# Patient Record
Sex: Female | Born: 1960 | Race: White | Hispanic: No | Marital: Married | Smoking: Never smoker
Health system: Southern US, Community
[De-identification: ages and names within clinical notes are randomized; demographics above are authoritative.]

## PROBLEM LIST (undated history)

## (undated) DIAGNOSIS — M8580 Other specified disorders of bone density and structure, unspecified site: Secondary | ICD-10-CM

## (undated) DIAGNOSIS — M810 Age-related osteoporosis without current pathological fracture: Secondary | ICD-10-CM

## (undated) DIAGNOSIS — M1990 Unspecified osteoarthritis, unspecified site: Secondary | ICD-10-CM

## (undated) DIAGNOSIS — E785 Hyperlipidemia, unspecified: Secondary | ICD-10-CM

## (undated) HISTORY — DX: Age-related osteoporosis without current pathological fracture: M81.0

## (undated) HISTORY — DX: Other specified disorders of bone density and structure, unspecified site: M85.80

## (undated) HISTORY — PX: LASIK: SHX215

## (undated) HISTORY — PX: TONSILLECTOMY AND ADENOIDECTOMY: SHX28

## (undated) HISTORY — DX: Unspecified osteoarthritis, unspecified site: M19.90

## (undated) HISTORY — PX: PELVIC LAPAROSCOPY: SHX162

## (undated) HISTORY — DX: Hyperlipidemia, unspecified: E78.5

---

## 1995-05-16 HISTORY — PX: OOPHORECTOMY: SHX86

## 2000-05-03 ENCOUNTER — Other Ambulatory Visit: Admission: RE | Admit: 2000-05-03 | Discharge: 2000-05-03 | Payer: Self-pay | Admitting: Gynecology

## 2002-07-22 ENCOUNTER — Other Ambulatory Visit: Admission: RE | Admit: 2002-07-22 | Discharge: 2002-07-22 | Payer: Self-pay | Admitting: Gynecology

## 2003-10-01 ENCOUNTER — Other Ambulatory Visit: Admission: RE | Admit: 2003-10-01 | Discharge: 2003-10-01 | Payer: Self-pay | Admitting: Gynecology

## 2004-10-18 ENCOUNTER — Other Ambulatory Visit: Admission: RE | Admit: 2004-10-18 | Discharge: 2004-10-18 | Payer: Self-pay | Admitting: Gynecology

## 2005-11-20 ENCOUNTER — Other Ambulatory Visit: Admission: RE | Admit: 2005-11-20 | Discharge: 2005-11-20 | Payer: Self-pay | Admitting: Gynecology

## 2006-12-17 ENCOUNTER — Other Ambulatory Visit: Admission: RE | Admit: 2006-12-17 | Discharge: 2006-12-17 | Payer: Self-pay | Admitting: Gynecology

## 2008-02-19 ENCOUNTER — Other Ambulatory Visit: Admission: RE | Admit: 2008-02-19 | Discharge: 2008-02-19 | Payer: Self-pay | Admitting: Gynecology

## 2008-02-19 ENCOUNTER — Encounter: Payer: Self-pay | Admitting: Gynecology

## 2008-02-19 ENCOUNTER — Ambulatory Visit: Payer: Self-pay | Admitting: Gynecology

## 2008-03-16 ENCOUNTER — Ambulatory Visit: Payer: Self-pay | Admitting: Gynecology

## 2009-04-21 ENCOUNTER — Other Ambulatory Visit: Admission: RE | Admit: 2009-04-21 | Discharge: 2009-04-21 | Payer: Self-pay | Admitting: Gynecology

## 2009-04-21 ENCOUNTER — Ambulatory Visit: Payer: Self-pay | Admitting: Gynecology

## 2010-05-11 ENCOUNTER — Ambulatory Visit: Payer: Self-pay | Admitting: Gynecology

## 2010-05-11 ENCOUNTER — Other Ambulatory Visit
Admission: RE | Admit: 2010-05-11 | Discharge: 2010-05-11 | Payer: Self-pay | Source: Home / Self Care | Admitting: Gynecology

## 2010-06-07 ENCOUNTER — Ambulatory Visit
Admission: RE | Admit: 2010-06-07 | Discharge: 2010-06-07 | Payer: Self-pay | Source: Home / Self Care | Attending: Gynecology | Admitting: Gynecology

## 2010-12-05 ENCOUNTER — Ambulatory Visit: Payer: Self-pay | Admitting: Unknown Physician Specialty

## 2011-06-05 ENCOUNTER — Other Ambulatory Visit: Payer: Self-pay | Admitting: *Deleted

## 2011-06-05 NOTE — Telephone Encounter (Signed)
Called in denial for refill on med.  Over due for annual exam.

## 2011-06-07 ENCOUNTER — Other Ambulatory Visit: Payer: Self-pay | Admitting: Gynecology

## 2011-06-19 ENCOUNTER — Ambulatory Visit (INDEPENDENT_AMBULATORY_CARE_PROVIDER_SITE_OTHER): Payer: PRIVATE HEALTH INSURANCE | Admitting: Gynecology

## 2011-06-19 ENCOUNTER — Encounter: Payer: Self-pay | Admitting: Gynecology

## 2011-06-19 VITALS — BP 126/80 | Ht 61.0 in | Wt 129.0 lb

## 2011-06-19 DIAGNOSIS — Z8041 Family history of malignant neoplasm of ovary: Secondary | ICD-10-CM

## 2011-06-19 DIAGNOSIS — R634 Abnormal weight loss: Secondary | ICD-10-CM

## 2011-06-19 DIAGNOSIS — N959 Unspecified menopausal and perimenopausal disorder: Secondary | ICD-10-CM

## 2011-06-19 DIAGNOSIS — Z01419 Encounter for gynecological examination (general) (routine) without abnormal findings: Secondary | ICD-10-CM

## 2011-06-19 LAB — URINALYSIS W MICROSCOPIC + REFLEX CULTURE
Hgb urine dipstick: NEGATIVE
Leukocytes, UA: NEGATIVE
Nitrite: NEGATIVE
Specific Gravity, Urine: 1.02 (ref 1.005–1.030)
pH: 5.5 (ref 5.0–8.0)

## 2011-06-19 LAB — CBC WITH DIFFERENTIAL/PLATELET
Eosinophils Absolute: 0.1 10*3/uL (ref 0.0–0.7)
Hemoglobin: 15.5 g/dL — ABNORMAL HIGH (ref 12.0–15.0)
Lymphocytes Relative: 38 % (ref 12–46)
Lymphs Abs: 2.3 10*3/uL (ref 0.7–4.0)
Monocytes Relative: 10 % (ref 3–12)
Neutro Abs: 3 10*3/uL (ref 1.7–7.7)
Neutrophils Relative %: 50 % (ref 43–77)
Platelets: 242 10*3/uL (ref 150–400)
RBC: 5.14 MIL/uL — ABNORMAL HIGH (ref 3.87–5.11)
WBC: 6 10*3/uL (ref 4.0–10.5)

## 2011-06-19 LAB — LIPID PANEL
Cholesterol: 240 mg/dL — ABNORMAL HIGH (ref 0–200)
LDL Cholesterol: 175 mg/dL — ABNORMAL HIGH (ref 0–99)
VLDL: 29 mg/dL (ref 0–40)

## 2011-06-19 LAB — GLUCOSE, RANDOM: Glucose, Bld: 89 mg/dL (ref 70–99)

## 2011-06-19 MED ORDER — MEDROXYPROGESTERONE ACETATE 10 MG PO TABS: 10.0000 mg | ORAL_TABLET | Freq: Every day | ORAL | Status: DC

## 2011-06-19 MED ORDER — ESTROGENS CONJUGATED 0.9 MG PO TABS: 0.9000 mg | ORAL_TABLET | Freq: Every day | ORAL | Status: DC

## 2011-06-19 NOTE — Progress Notes (Signed)
Shannon Klein November 20, 1960 [id number]   History:    51 y.o.  for annual exam with no complaints. Review of patient's records indicated that in 1997 she had prophylactic bilateral salpingo-oophorectomy due to months history of ovarian cancer at the age of 84. Patient has been on hormone replacement therapy since that time on several regimens and most recently she had been on Estratest 1.25 mg daily with the addition of Provera 10 mg daily. She denies any vasomotor symptoms and is having normal menstrual cycles. She was weighing 132 pounds last year and was weighing now 126. Her last dose density study was in January 2012 we decreased bone mineralization in the osteopenia range with normal Frax analysis. She is taking her calcium and vitamin D twice a day. Patient had been offered BRCA one BRCA2 testing as well as genetic counseling but has refused. She does have several aunts with breast cancer but are on her father's side.  Past medical history,surgical history, family history and social history were all reviewed and documented in the EPIC chart.  Gynecologic History Patient's last menstrual period was 06/07/2011. Contraception: Bilateral subbing oophorectomy Last Pap: December 2011. Results were: normal Last mammogram: 2011. Results were: normal  Obstetric History OB History    Grav Para Term Preterm Abortions TAB SAB Ect Mult Living   2 2 2       2      # Outc Date GA Lbr Len/2nd Wgt Sex Del Anes PTL Lv   1 TRM     M CS  No Yes   2 TRM     M CS  No Yes       ROS:  Was performed and pertinent positives and negatives are included in the history.  Exam: chaperone present  BP 126/80  Ht 5\' 1"  (1.549 m)  Wt 129 lb (58.514 kg)  BMI 24.37 kg/m2  LMP 06/07/2011  Body mass index is 24.37 kg/(m^2).  General appearance : Well developed well nourished female. No acute distress HEENT: Neck supple, trachea midline, no carotid bruits, no thyroidmegaly Lungs: Clear to auscultation, no rhonchi  or wheezes, or rib retractions  Heart: Regular rate and rhythm, no murmurs or gallops Breast:Examined in sitting and supine position were symmetrical in appearance, no palpable masses or tenderness,  no skin retraction, no nipple inversion, no nipple discharge, no skin discoloration, no axillary or supraclavicular lymphadenopathy Abdomen: no palpable masses or tenderness, no rebound or guarding Extremities: no edema or skin discoloration or tenderness  Pelvic:  Bartholin, Urethra, Skene Glands: Within normal limits             Vagina: No gross lesions or discharge  Cervix: No gross lesions or discharge  Uterus  anteverted, normal size, shape and consistency, non-tender and mobile  Adnexa  Without masses or tenderness  Anus and perineum  normal   Rectovaginal  normal sphincter tone without palpated masses or tenderness             Hemoccult not done     Assessment/Plan:  51 y.o. female for annual exam unremarkable exam. We had a lengthy discussion once again on the women's health initiative study and the risks benefits and pros and cons of hormone replacement therapy. She has been on it since 1997. We will attempt once again to taper her down gradually. She will be prescribed Premarin 0.9 mg to take 1 by mouth daily with the addition of Provera 10 mg for 10 days of the month. She'll be scheduled her  mammogram which is overdue. She was instructed to continue her calcium and vitamin D twice a day along with regular weightbearing exercises for osteoporosis prevention. The following lab work will be drawn today: Fasting lipid profile, TSH, blood sugar, urinalysis and CBC. New screening guidelines for Pap smears were discussed. No Pap smear done today next Pap smear in 2 years. Patient had been seen by gastroenterologist because of heme positive stools last year with only internal hemorrhoids detected. Next colonoscopy in 10 years.    Ok Edwards MD, 10:08 AM 06/19/2011

## 2011-06-19 NOTE — Patient Instructions (Signed)

## 2011-06-21 ENCOUNTER — Other Ambulatory Visit: Payer: Self-pay | Admitting: *Deleted

## 2011-06-21 DIAGNOSIS — E7800 Pure hypercholesterolemia, unspecified: Secondary | ICD-10-CM

## 2011-10-31 ENCOUNTER — Other Ambulatory Visit: Payer: Self-pay | Admitting: *Deleted

## 2011-10-31 DIAGNOSIS — R928 Other abnormal and inconclusive findings on diagnostic imaging of breast: Secondary | ICD-10-CM

## 2011-11-07 ENCOUNTER — Encounter: Payer: Self-pay | Admitting: Gynecology

## 2011-11-13 ENCOUNTER — Encounter: Payer: Self-pay | Admitting: Gynecology

## 2012-07-03 ENCOUNTER — Other Ambulatory Visit: Payer: Self-pay | Admitting: Gynecology

## 2012-07-06 ENCOUNTER — Other Ambulatory Visit: Payer: Self-pay | Admitting: Gynecology

## 2012-07-22 ENCOUNTER — Encounter: Payer: PRIVATE HEALTH INSURANCE | Admitting: Gynecology

## 2012-09-17 ENCOUNTER — Other Ambulatory Visit: Payer: Self-pay | Admitting: Gynecology

## 2012-09-30 ENCOUNTER — Encounter: Payer: Self-pay | Admitting: Gynecology

## 2012-10-21 ENCOUNTER — Encounter: Payer: Self-pay | Admitting: Gynecology

## 2012-10-21 ENCOUNTER — Other Ambulatory Visit (HOSPITAL_COMMUNITY)
Admission: RE | Admit: 2012-10-21 | Discharge: 2012-10-21 | Disposition: A | Discharge status: Home / Self Care | Payer: PRIVATE HEALTH INSURANCE | Source: Ambulatory Visit | Attending: Gynecology | Admitting: Gynecology

## 2012-10-21 ENCOUNTER — Ambulatory Visit (INDEPENDENT_AMBULATORY_CARE_PROVIDER_SITE_OTHER): Payer: PRIVATE HEALTH INSURANCE | Admitting: Gynecology

## 2012-10-21 VITALS — BP 120/70 | Ht 60.5 in | Wt 133.0 lb

## 2012-10-21 DIAGNOSIS — Z23 Encounter for immunization: Secondary | ICD-10-CM

## 2012-10-21 DIAGNOSIS — Z1159 Encounter for screening for other viral diseases: Secondary | ICD-10-CM

## 2012-10-21 DIAGNOSIS — E8941 Symptomatic postprocedural ovarian failure: Secondary | ICD-10-CM

## 2012-10-21 DIAGNOSIS — Z01419 Encounter for gynecological examination (general) (routine) without abnormal findings: Secondary | ICD-10-CM

## 2012-10-21 DIAGNOSIS — M19049 Primary osteoarthritis, unspecified hand: Secondary | ICD-10-CM | POA: Insufficient documentation

## 2012-10-21 DIAGNOSIS — E8940 Asymptomatic postprocedural ovarian failure: Secondary | ICD-10-CM

## 2012-10-21 DIAGNOSIS — Z1151 Encounter for screening for human papillomavirus (HPV): Secondary | ICD-10-CM | POA: Insufficient documentation

## 2012-10-21 LAB — HEPATITIS C ANTIBODY: HCV Ab: NEGATIVE

## 2012-10-21 MED ORDER — ESTROGENS CONJUGATED 0.625 MG PO TABS: ORAL_TABLET | ORAL | Status: DC

## 2012-10-21 MED ORDER — MEDROXYPROGESTERONE ACETATE 10 MG PO TABS: ORAL_TABLET | ORAL | Status: DC

## 2012-10-21 NOTE — Patient Instructions (Signed)

## 2012-10-21 NOTE — Addendum Note (Signed)
Addended by: Bertram Savin A on: 10/21/2012 04:20 PM   Modules accepted: Orders

## 2012-10-21 NOTE — Addendum Note (Signed)
Addended by: Bertram Savin A on: 10/21/2012 03:49 PM   Modules accepted: Orders

## 2012-10-21 NOTE — Progress Notes (Signed)
Shannon Klein 17-Aug-1960 [id number]   History:    52 y.o.  for annual gyn exam who in 1997  had prophylactic bilateral salpingo-oophorectomy due to mother's history of ovarian cancer at the age of 33. Patient has been on hormone replacement therapy since that time and we have been taping her down. She was on 0.9 mg of Premarin daily with the addition of Provera 10 mg for 10 days a month. Her last dose density study was in January 2012 we decreased bone mineralization in the osteopenia range with normal Frax analysis. She is taking her calcium and vitamin D twice a day. Patient had been offered BRCA one BRCA2 testing as well as genetic counseling but has refused. She does have several aunts with breast cancer but are on her father's side. The patient's primary physician has been doing her lab work. Patient has not had her T. Vaccine. She had a normal colonoscopy 2 years ago. 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  Gynecologic History Patient's last menstrual period was 09/09/2012. Contraception: Bilateral salpingo-oophorectomy Last Pap: 2011. Results were: normal Last mammogram: 2013. Results were: normal  Obstetric History OB History   Grav Para Term Preterm Abortions TAB SAB Ect Mult Living   2 2 2       2      # Outc Date GA Lbr Len/2nd Wgt Sex Del Anes PTL Lv   1 TRM     M CS  No Yes   2 TRM     M CS  No Yes       ROS: A ROS was performed and pertinent positives and negatives are included in the history.  GENERAL: No fevers or chills. HEENT: No change in vision, no earache, sore throat or sinus congestion. NECK: No pain or stiffness. CARDIOVASCULAR: No chest pain or pressure. No palpitations. PULMONARY: No shortness of breath, cough or wheeze. GASTROINTESTINAL: No abdominal pain, nausea, vomiting or diarrhea, melena  or bright red blood per rectum. GENITOURINARY: No urinary frequency, urgency, hesitancy or dysuria. MUSCULOSKELETAL: No joint or muscle pain, no back pain, no recent trauma. DERMATOLOGIC: No rash, no itching, no lesions. ENDOCRINE: No polyuria, polydipsia, no heat or cold intolerance. No recent change in weight. HEMATOLOGICAL: No anemia or easy bruising or bleeding. NEUROLOGIC: No headache, seizures, numbness, tingling or weakness. PSYCHIATRIC: No depression, no loss of interest in normal activity or change in sleep pattern.     Exam: chaperone present  BP 120/70  Ht 5' 0.5" (1.537 m)  Wt 133 lb (60.328 kg)  BMI 25.54 kg/m2  LMP 09/09/2012  Body mass index is 25.54 kg/(m^2).  General appearance : Well developed well nourished female. No acute distress HEENT: Neck supple, trachea midline, no carotid bruits, no thyroidmegaly Lungs: Clear to auscultation, no rhonchi or wheezes, or rib retractions  Heart: Regular rate and rhythm, no murmurs or gallops Breast:Examined in sitting and supine position were symmetrical in appearance, no palpable masses or tenderness,  no skin retraction, no nipple inversion, no nipple discharge, no skin discoloration, no axillary or supraclavicular lymphadenopathy Abdomen: no palpable masses or tenderness, no rebound or guarding Extremities: no edema or skin discoloration or tenderness  Pelvic:  Bartholin, Urethra, Skene Glands: Within normal limits             Vagina: No gross lesions or discharge  Cervix: No  gross lesions or discharge  Uterus  anteverted, normal size, shape and consistency, non-tender and mobile  Adnexa  Without masses or tenderness  Anus and perineum  normal   Rectovaginal  normal sphincter tone without palpated masses or tenderness             Hemoccult Hemoccult provided     Assessment/Plan:  52 y.o. female for annual exam we'll get her lab work done by her primary physician. We will do a Pap smear today since it has been 3  years.  New CDC guidelines is recommending patients be tested once in her lifetime for hepatitis C antibody who were born between [birth year] through 1963. This was discussed with the patient today and has agreed to be tested today.  Hemoccult cards were provided. Tdap vaccine was administered today. Literature information was provided. She will schedule a bone density study next couple weeks. She was reminded to follow up on her mammogram next month. We discussed the importance of calcium vitamin D for osteoporosis prevention. We are going to continue to taper her Premarin. We will bring her down to 0.625 mg daily with additional Provera 10 mg for 10 days a month. We once again discussed the women's health initiative study the potential risk of breast cancer for long term usage of estrogen.    Ok Edwards MD, 3:29 PM 10/21/2012

## 2012-10-23 ENCOUNTER — Encounter: Payer: Self-pay | Admitting: Gynecology

## 2012-11-12 ENCOUNTER — Ambulatory Visit (INDEPENDENT_AMBULATORY_CARE_PROVIDER_SITE_OTHER): Payer: PRIVATE HEALTH INSURANCE

## 2012-11-12 DIAGNOSIS — E8940 Asymptomatic postprocedural ovarian failure: Secondary | ICD-10-CM

## 2012-11-12 DIAGNOSIS — M8580 Other specified disorders of bone density and structure, unspecified site: Secondary | ICD-10-CM

## 2012-11-12 DIAGNOSIS — M899 Disorder of bone, unspecified: Secondary | ICD-10-CM

## 2013-07-25 ENCOUNTER — Other Ambulatory Visit: Payer: Self-pay | Admitting: Gynecology

## 2013-10-28 ENCOUNTER — Other Ambulatory Visit: Payer: Self-pay | Admitting: Gynecology

## 2013-12-18 ENCOUNTER — Other Ambulatory Visit: Payer: Self-pay | Admitting: Gynecology

## 2013-12-29 ENCOUNTER — Ambulatory Visit (INDEPENDENT_AMBULATORY_CARE_PROVIDER_SITE_OTHER): Payer: PRIVATE HEALTH INSURANCE | Admitting: Gynecology

## 2013-12-29 ENCOUNTER — Encounter: Payer: Self-pay | Admitting: Gynecology

## 2013-12-29 VITALS — BP 134/74 | Ht 61.0 in | Wt 131.6 lb

## 2013-12-29 DIAGNOSIS — Z79890 Hormone replacement therapy (postmenopausal): Secondary | ICD-10-CM

## 2013-12-29 DIAGNOSIS — Z01419 Encounter for gynecological examination (general) (routine) without abnormal findings: Secondary | ICD-10-CM

## 2013-12-29 DIAGNOSIS — F411 Generalized anxiety disorder: Secondary | ICD-10-CM

## 2013-12-29 DIAGNOSIS — F419 Anxiety disorder, unspecified: Secondary | ICD-10-CM

## 2013-12-29 MED ORDER — PAROXETINE HCL ER 12.5 MG PO TB24: 12.5000 mg | ORAL_TABLET | ORAL | Status: DC

## 2013-12-29 MED ORDER — ESTROGENS CONJUGATED 0.3 MG PO TABS: 0.3000 mg | ORAL_TABLET | Freq: Every day | ORAL | Status: DC

## 2013-12-29 MED ORDER — MEDROXYPROGESTERONE ACETATE 10 MG PO TABS: 10.0000 mg | ORAL_TABLET | Freq: Every day | ORAL | Status: DC

## 2013-12-29 NOTE — Patient Instructions (Signed)
Paroxetine Controlled-Release Tablets What is this medicine? PAROXETINE (pa ROX e teen) is used to treat depression. It may also be used to treat anxiety disorders, obsessive compulsive disorder, panic attacks, post traumatic stress, and premenstrual dysphoric disorder (PMDD). This medicine may be used for other purposes; ask your health care provider or pharmacist if you have questions. COMMON BRAND NAME(S): Paxil CR What should I tell my health care provider before I take this medicine? They need to know if you have any of these conditions: -bleeding disorders -glaucoma -heart disease -kidney disease -liver disease -low levels of sodium in the blood -mania or bipolar disorder -seizures -suicidal thoughts, plans, or attempt; a previous suicide attempt by you or a family member -take MAOIs like Carbex, Eldepryl, Marplan, Nardil, and Parnate -take medicines that treat or prevent blood clots -an unusual or allergic reaction to paroxetine, other medicines, foods, dyes, or preservatives -pregnant or trying to get pregnant -breast-feeding How should I use this medicine? Take this medicine by mouth with a glass of water. Follow the directions on the prescription label. You can take it with or without food. Do not crush or chew this medicine. Take your medicine at regular intervals. Do not take your medicine more often than directed. Do not stop taking this medicine suddenly except upon the advice of your doctor. Stopping this medicine too quickly may cause serious side effects or your condition may worsen. A special MedGuide will be given to you by the pharmacist with each prescription and refill. Be sure to read this information carefully each time. Talk to your pediatrician regarding the use of this medicine in children. Special care may be needed. Overdosage: If you think you have taken too much of this medicine contact a poison control center or emergency room at once. NOTE: This medicine is  only for you. Do not share this medicine with others. What if I miss a dose? If you miss a dose, take it as soon as you can. If it is almost time for your next dose, take only that dose. Do not take double or extra doses. What may interact with this medicine? Do not take this medicine with any of the following medications: -linezolid -MAOIs like Carbex, Eldepryl, Marplan, Nardil, and Parnate -methylene blue (injected into a vein) -pimozide -thioridazine This medicine may also interact with the following medications: -alcohol -antacids -aspirin and aspirin-like medicines -atomoxetine -certain medicines for depression, anxiety, or psychotic disturbances -certain medicines for irregular heart beat like propafenone, flecainide, encainide, and quinidine -certain medicines for migraine headache like almotriptan, eletriptan, frovatriptan, naratriptan, rizatriptan, sumatriptan, zolmitriptan -cimetidine -digoxin -diuretics -fentanyl -fosamprenavir/ritonavir -furazolidone -isoniazid -lithium -medicines that treat or prevent blood clots like warfarin, enoxaparin, and dalteparin -medicines for sleep -metoprolol -NSAIDs, medicines for pain and inflammation, like ibuprofen or naproxen -phenobarbital -phenytoin -procarbazine -procyclidine -rasagiline -supplements like St. John's wort, kava kava, valerian -tamoxifen -theophylline -tramadol -tryptophan This list may not describe all possible interactions. Give your health care provider a list of all the medicines, herbs, non-prescription drugs, or dietary supplements you use. Also tell them if you smoke, drink alcohol, or use illegal drugs. Some items may interact with your medicine. What should I watch for while using this medicine? Tell your doctor if your symptoms do not get better or if they get worse. Visit your doctor or health care professional for regular checks on your progress. Because it may take several weeks to see the full  effects of this medicine, it is important to continue your treatment as prescribed  by your doctor. Patients and their families should watch out for new or worsening thoughts of suicide or depression. Also watch out for sudden changes in feelings such as feeling anxious, agitated, panicky, irritable, hostile, aggressive, impulsive, severely restless, overly excited and hyperactive, or not being able to sleep. If this happens, especially at the beginning of treatment or after a change in dose, call your health care professional. Dennis Bast may get drowsy or dizzy. Do not drive, use machinery, or do anything that needs mental alertness until you know how this medicine affects you. Do not stand or sit up quickly, especially if you are an older patient. This reduces the risk of dizzy or fainting spells. Alcohol may interfere with the effect of this medicine. Avoid alcoholic drinks. Your mouth may get dry. Chewing sugarless gum or sucking hard candy, and drinking plenty of water will help. Contact your doctor if the problem does not go away or is severe. What side effects may I notice from receiving this medicine? Side effects that you should report to your doctor or health care professional as soon as possible: -allergic reactions like skin rash, itching or hives, swelling of the face, lips, or tongue -black or bloody stools, blood in the urine or vomit -fast, irregular heartbeat -hallucination, loss of contact with reality -painful or prolonged erection (men) -seizures -suicidal thoughts or other mood changes -trouble passing urine or change in the amount of urine -unusual bleeding or bruising -unusually weak or tired -vomiting Side effects that usually do not require medical attention (report to your doctor or health care professional if they continue or are bothersome): -change in appetite, weight -change in sex drive or performance -constipation or diarrhea -difficulty  sleeping -drowsy -headache -increased sweating -muscle pain or weakness -tremors This list may not describe all possible side effects. Call your doctor for medical advice about side effects. You may report side effects to FDA at 1-800-FDA-1088. Where should I keep my medicine? Keep out of the reach of children. Store at or below 25 degrees C (77 degrees F). Throw away any unused medicine after the expiration date. NOTE: This sheet is a summary. It may not cover all possible information. If you have questions about this medicine, talk to your doctor, pharmacist, or health care provider.  2015, [employer]. (2011-12-14 17:51:56)

## 2013-12-29 NOTE — Progress Notes (Signed)
Shannon Klein 1961/04/10 [id number]   History:    53 y.o.  for annual gyn exam who has been under a lot of stress recently with anxiety and apprehension. Patient back in 1997 had prophylactic bilateral salpingo-oophorectomy due to mother's history of ovarian cancer at the age of 84. Patient has been on hormone replacement therapy since that time and we have been taping her down. She was on 0.9 mg of Premarin daily with the addition of Provera 10 mg for 10 days a month. We have been tapering her dose for which she was currently on 0.625 mg of the Premarin. As a result of her stress she is established at times oral mucosal ulcers for which she has talked with over with her PCP. She has seen a gastroenterologist in the past who has diagnosed her with GERD.Her last dose density study was in January 2012 we decreased bone mineralization in the osteopenia range with normal Frax analysis. She is taking her calcium and vitamin D twice a day. Patient had been offered BRCA one BRCA2 testing as well as genetic counseling but has refused. She does have several aunts with breast cancer but are on her father's side. The patient's primary physician has been doing her lab work. The patient has received her Tdap vaccine. 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  Gynecologic History No LMP recorded. Patient is not currently having periods (Reason: Perimenopausal). Contraception: post menopausal status Last Pap: 2014. Results were: normal Last mammogram: 2013. Results were: normal  Obstetric History OB History  Gravida Para Term Preterm AB SAB TAB Ectopic Multiple Living  _0 # Outcome Date GA Lbr Len/2nd Weight Sex Delivery Anes PTL Lv  2 TRM     M CS  N Y  1 TRM     M CS  N Y       ROS: A ROS was performed and pertinent  positives and negatives are included in the history.  GENERAL: No fevers or chills. HEENT: No change in vision, no earache, sore throat or sinus congestion. NECK: No pain or stiffness. CARDIOVASCULAR: No chest pain or pressure. No palpitations. PULMONARY: No shortness of breath, cough or wheeze. GASTROINTESTINAL: No abdominal pain, nausea, vomiting or diarrhea, melena or bright red blood per rectum. GENITOURINARY: No urinary frequency, urgency, hesitancy or dysuria. MUSCULOSKELETAL: No joint or muscle pain, no back pain, no recent trauma. DERMATOLOGIC: No rash, no itching, no lesions. ENDOCRINE: No polyuria, polydipsia, no heat or cold intolerance. No recent change in weight. HEMATOLOGICAL: No anemia or easy bruising or bleeding. NEUROLOGIC: No headache, seizures, numbness, tingling or weakness. PSYCHIATRIC: No depression, no loss of interest in normal activity or change in sleep pattern.     Exam: chaperone present  BP 134/74  Ht _1  (1.549 m)  Wt 131 lb 9.6 oz (59.693 kg)  BMI 24.88 kg/m2  Body mass index is 24.88 kg/(m^2).  General appearance : Well developed well nourished female. No acute distress HEENT: Neck supple, trachea midline, no carotid bruits, no thyroidmegaly Lungs: Clear to auscultation, no rhonchi or wheezes, or rib retractions  Heart: Regular rate and rhythm, no murmurs or gallops Breast:Examined in sitting and supine position were symmetrical in appearance, no palpable masses or tenderness,  no skin retraction, no  nipple inversion, no nipple discharge, no skin discoloration, no axillary or supraclavicular lymphadenopathy Abdomen: no palpable masses or tenderness, no rebound or guarding Extremities: no edema or skin discoloration or tenderness  Pelvic:  Bartholin, Urethra, Skene Glands: Within normal limits             Vagina: No gross lesions or discharge  Cervix: No gross lesions or discharge  Uterus  anteverted, normal size, shape and consistency, non-tender and  mobile  Adnexa  Without masses or tenderness  Anus and perineum  normal   Rectovaginal  normal sphincter tone without palpated masses or tenderness             Hemoccult PCP we'll provide     Assessment/Plan:  53 y.o. female for annual exam who has been on HRT for many years. Patient had prophylactic bilateral salpingo-oophorectomy due to strong family history of ovarian cancer. 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 She is suffering from a lot of anxiety from work and home. We discussed starting her on Paxil CR 12.5 mg 1 by mouth daily and continue to taper down her estrogen to 0.3 mg daily with the addition of Provera 10 mg for 10 days of each month. The ultimate goal will be to make sure completely take her off HRT. We discussed importance of calcium vitamin D and regular exercise for osteoporosis prevention next year will do a bone density study. PCP has been doing her blood work.  Note: This dictation was prepared with  Dragon/digital dictation along withSmart phrase technology. Any transcriptional errors that result from this process are unintentional.   Terrance Mass MD, 12:05 PM 12/29/2013

## 2014-03-16 ENCOUNTER — Encounter: Payer: Self-pay | Admitting: Gynecology

## 2014-12-31 ENCOUNTER — Other Ambulatory Visit: Payer: Self-pay | Admitting: Gynecology

## 2015-02-16 ENCOUNTER — Other Ambulatory Visit: Payer: Self-pay | Admitting: Gynecology

## 2015-03-22 ENCOUNTER — Ambulatory Visit (INDEPENDENT_AMBULATORY_CARE_PROVIDER_SITE_OTHER): Payer: PRIVATE HEALTH INSURANCE | Admitting: Gynecology

## 2015-03-22 ENCOUNTER — Encounter: Payer: Self-pay | Admitting: Gynecology

## 2015-03-22 VITALS — BP 128/76 | Ht 61.0 in | Wt 131.0 lb

## 2015-03-22 DIAGNOSIS — M8580 Other specified disorders of bone density and structure, unspecified site: Secondary | ICD-10-CM | POA: Diagnosis not present

## 2015-03-22 DIAGNOSIS — Z01419 Encounter for gynecological examination (general) (routine) without abnormal findings: Secondary | ICD-10-CM

## 2015-03-22 DIAGNOSIS — E8940 Asymptomatic postprocedural ovarian failure: Secondary | ICD-10-CM

## 2015-03-22 DIAGNOSIS — N958 Other specified menopausal and perimenopausal disorders: Secondary | ICD-10-CM

## 2015-03-22 DIAGNOSIS — Z79890 Hormone replacement therapy (postmenopausal): Secondary | ICD-10-CM | POA: Diagnosis not present

## 2015-03-22 LAB — COMPREHENSIVE METABOLIC PANEL
ALBUMIN: 4.3 g/dL (ref 3.6–5.1)
ALK PHOS: 82 U/L (ref 33–130)
ALT: 16 U/L (ref 6–29)
AST: 16 U/L (ref 10–35)
BILIRUBIN TOTAL: 0.4 mg/dL (ref 0.2–1.2)
BUN: 11 mg/dL (ref 7–25)
CALCIUM: 9 mg/dL (ref 8.6–10.4)
CO2: 25 mmol/L (ref 20–31)
Chloride: 103 mmol/L (ref 98–110)
Creat: 0.68 mg/dL (ref 0.50–1.05)
Glucose, Bld: 86 mg/dL (ref 65–99)
Potassium: 4 mmol/L (ref 3.5–5.3)
Sodium: 141 mmol/L (ref 135–146)
TOTAL PROTEIN: 7 g/dL (ref 6.1–8.1)

## 2015-03-22 LAB — CBC WITH DIFFERENTIAL/PLATELET
Basophils Absolute: 0 10*3/uL (ref 0.0–0.1)
Basophils Relative: 0 % (ref 0–1)
Eosinophils Absolute: 0.1 10*3/uL (ref 0.0–0.7)
Eosinophils Relative: 1 % (ref 0–5)
HEMATOCRIT: 44.3 % (ref 36.0–46.0)
HEMOGLOBIN: 14.8 g/dL (ref 12.0–15.0)
Lymphocytes Relative: 33 % (ref 12–46)
Lymphs Abs: 2.1 10*3/uL (ref 0.7–4.0)
MCH: 29 pg (ref 26.0–34.0)
MCHC: 33.4 g/dL (ref 30.0–36.0)
MCV: 86.9 fL (ref 78.0–100.0)
MONO ABS: 0.4 10*3/uL (ref 0.1–1.0)
MPV: 9.4 fL (ref 8.6–12.4)
Monocytes Relative: 7 % (ref 3–12)
Neutro Abs: 3.8 10*3/uL (ref 1.7–7.7)
Neutrophils Relative %: 59 % (ref 43–77)
Platelets: 258 10*3/uL (ref 150–400)
RBC: 5.1 MIL/uL (ref 3.87–5.11)
RDW: 13.2 % (ref 11.5–15.5)
WBC: 6.4 10*3/uL (ref 4.0–10.5)

## 2015-03-22 LAB — TSH: TSH: 1.058 u[IU]/mL (ref 0.350–4.500)

## 2015-03-22 LAB — LIPID PANEL
CHOLESTEROL: 216 mg/dL — AB (ref 125–200)
HDL: 37 mg/dL — AB (ref >=46)
LDL Cholesterol: 116 mg/dL (ref <130)
Total CHOL/HDL Ratio: 5.8 Ratio — ABNORMAL HIGH (ref <=5.0)
Triglycerides: 314 mg/dL — ABNORMAL HIGH (ref <150)
VLDL: 63 mg/dL — ABNORMAL HIGH (ref <30)

## 2015-03-22 MED ORDER — ESTROGENS CONJUGATED 0.3 MG PO TABS: ORAL_TABLET | ORAL | Status: DC

## 2015-03-22 MED ORDER — MEDROXYPROGESTERONE ACETATE 10 MG PO TABS: 10.0000 mg | ORAL_TABLET | Freq: Every day | ORAL | Status: DC

## 2015-03-22 NOTE — Progress Notes (Signed)
Shannon Klein [birth year] [id number]   History:    54 y.o.  for annual gyn exam with no complaints today. Review of patient's record indicated that back in 1997 had prophylactic bilateral salpingo-oophorectomy due to mother's history of ovarian cancer at the age of 30. Patient has been on hormone replacement therapy since that time and we have been taping her down. She was on 0.9 mg of Premarin daily with the addition of Provera 10 mg for 10 days a month. she is now on Premarin 0.3 mg daily with the addition of Provera 10 mg for 10 days of the month. Patient reports very little once a month spotting not lasting more than one day. Patient would like to have her blood work drawn today here in the office and she is fasting.  Bone density study 2014 lowest T score was -2.3 at the [facility]. Right femoral neck -1.9 with normal Frax analysis. Patient is taking her calcium and vitamin D daily. Patient had been offered BRCA one BRCA2 testing as well as genetic counseling but has refused. She does have several aunts with breast cancer but are on her father's side.  Patient has received the Tdap vaccine the past. Patient refuses flu vaccine today. Patient had a normal colonoscopy in 2012.  Past medical history,surgical history, family history and social history were all reviewed and documented in the EPIC chart.  Gynecologic History No LMP recorded. Patient is not currently having periods (Reason: Perimenopausal). Contraception: post menopausal status Last Pap: 2014. Results were: normal Last mammogram: 2013. Results were: normal  Obstetric History OB History  Gravida Para Term Preterm AB SAB TAB Ectopic Multiple Living  2 2 2       2     # Outcome Date GA Lbr Len/2nd Weight Sex Delivery Anes PTL Lv  2 Term     M CS-Unspec  N Y  1 Term     M CS-Unspec  N Y       ROS: A ROS was performed and pertinent positives and negatives are included in the history.  GENERAL: No fevers or chills. HEENT: No  change in vision, no earache, sore throat or sinus congestion. NECK: No pain or stiffness. CARDIOVASCULAR: No chest pain or pressure. No palpitations. PULMONARY: No shortness of breath, cough or wheeze. GASTROINTESTINAL: No abdominal pain, nausea, vomiting or diarrhea, melena or bright red blood per rectum. GENITOURINARY: No urinary frequency, urgency, hesitancy or dysuria. MUSCULOSKELETAL: No joint or muscle pain, no back pain, no recent trauma. DERMATOLOGIC: No rash, no itching, no lesions. ENDOCRINE: No polyuria, polydipsia, no heat or cold intolerance. No recent change in weight. HEMATOLOGICAL: No anemia or easy bruising or bleeding. NEUROLOGIC: No headache, seizures, numbness, tingling or weakness. PSYCHIATRIC: No depression, no loss of interest in normal activity or change in sleep pattern.     Exam: chaperone present  BP 128/76 mmHg  Ht 5' 1"  (1.549 m)  Wt 131 lb (59.421 kg)  BMI 24.76 kg/m2  Body mass index is 24.76 kg/(m^2).  General appearance : Well developed well nourished female. No acute distress HEENT: Eyes: no retinal hemorrhage or exudates,  Neck supple, trachea midline, no carotid bruits, no thyroidmegaly Lungs: Clear to auscultation, no rhonchi or wheezes, or rib retractions  Heart: Regular rate and rhythm, no murmurs or gallops Breast:Examined in sitting and supine position were symmetrical in appearance, no palpable masses or tenderness,  no skin retraction, no nipple inversion, no nipple discharge, no skin discoloration, no axillary or supraclavicular  lymphadenopathy Abdomen: no palpable masses or tenderness, no rebound or guarding Extremities: no edema or skin discoloration or tenderness  Pelvic:  Bartholin, Urethra, Skene Glands: Within normal limits             Vagina: No gross lesions or discharge  Cervix: No gross lesions or discharge  Uterus  anteverted, normal size, shape and consistency, non-tender and mobile  Adnexa  Without masses or tenderness  Anus and  perineum  normal   Rectovaginal  normal sphincter tone without palpated masses or tenderness             Hemoccult cards provided     Assessment/Plan:  54 y.o. female for annual exam with family history of ovarian cancer status post laparoscopic bilateral salpingo-oophorectomy many years ago. Patient doing well on low dose estrogen 0.3 mg daily with the addition of Provera 10 mg for 10 days of the month. We'll consider tapering her off completely within the next 2-3 years. We discussed importance of calcium and vitamin D and regular exercise for osteoporosis prevention. Patient to schedule bone density study here in the office in the next few weeks. She is already scheduled for her overdue mammogram in December. She was reminded on the importance of monthly breast exam. She was reminded to submit to the office the fecal Hemoccult cards for testing.   Terrance Mass MD, 1:00 PM 03/22/2015

## 2015-03-22 NOTE — Patient Instructions (Signed)

## 2015-03-23 ENCOUNTER — Encounter: Payer: Self-pay | Admitting: Gynecology

## 2015-03-23 LAB — URINALYSIS W MICROSCOPIC + REFLEX CULTURE
BILIRUBIN URINE: NEGATIVE
Bacteria, UA: NONE SEEN [HPF]
Casts: NONE SEEN [LPF]
Crystals: NONE SEEN [HPF]
GLUCOSE, UA: NEGATIVE
Hgb urine dipstick: NEGATIVE
Ketones, ur: NEGATIVE
LEUKOCYTES UA: NEGATIVE
NITRITE: NEGATIVE
PH: 6.5 (ref 5.0–8.0)
Protein, ur: NEGATIVE
RBC / HPF: NONE SEEN RBC/HPF (ref <=2)
SQUAMOUS EPITHELIAL / LPF: NONE SEEN [HPF] (ref <=5)
Specific Gravity, Urine: 1.006 (ref 1.001–1.035)
WBC UA: NONE SEEN WBC/HPF (ref <=5)
YEAST: NONE SEEN [HPF]

## 2015-03-23 LAB — VITAMIN D 25 HYDROXY (VIT D DEFICIENCY, FRACTURES): Vit D, 25-Hydroxy: 32 ng/mL (ref 30–100)

## 2015-04-15 ENCOUNTER — Ambulatory Visit (INDEPENDENT_AMBULATORY_CARE_PROVIDER_SITE_OTHER): Payer: Managed Care, Other (non HMO)

## 2015-04-15 ENCOUNTER — Other Ambulatory Visit: Payer: Self-pay | Admitting: Gynecology

## 2015-04-15 DIAGNOSIS — N958 Other specified menopausal and perimenopausal disorders: Secondary | ICD-10-CM | POA: Diagnosis not present

## 2015-04-15 DIAGNOSIS — M810 Age-related osteoporosis without current pathological fracture: Secondary | ICD-10-CM

## 2015-04-15 DIAGNOSIS — E8940 Asymptomatic postprocedural ovarian failure: Secondary | ICD-10-CM

## 2015-04-15 DIAGNOSIS — M8580 Other specified disorders of bone density and structure, unspecified site: Secondary | ICD-10-CM

## 2015-11-10 ENCOUNTER — Other Ambulatory Visit: Payer: Self-pay | Admitting: Gynecology

## 2015-11-10 DIAGNOSIS — Z1231 Encounter for screening mammogram for malignant neoplasm of breast: Secondary | ICD-10-CM

## 2015-11-22 ENCOUNTER — Ambulatory Visit
Admission: RE | Admit: 2015-11-22 | Discharge: 2015-11-22 | Disposition: A | Discharge status: Home / Self Care | Payer: PRIVATE HEALTH INSURANCE | Source: Ambulatory Visit | Attending: Gynecology | Admitting: Gynecology

## 2015-11-22 DIAGNOSIS — Z1231 Encounter for screening mammogram for malignant neoplasm of breast: Secondary | ICD-10-CM | POA: Diagnosis present

## 2015-11-22 DIAGNOSIS — R928 Other abnormal and inconclusive findings on diagnostic imaging of breast: Secondary | ICD-10-CM | POA: Insufficient documentation

## 2015-11-29 ENCOUNTER — Other Ambulatory Visit: Payer: Self-pay | Admitting: Gynecology

## 2015-11-29 DIAGNOSIS — N6489 Other specified disorders of breast: Secondary | ICD-10-CM

## 2015-12-27 ENCOUNTER — Ambulatory Visit
Admission: RE | Admit: 2015-12-27 | Discharge: 2015-12-27 | Disposition: A | Discharge status: Home / Self Care | Payer: Managed Care, Other (non HMO) | Source: Ambulatory Visit | Attending: Gynecology | Admitting: Gynecology

## 2015-12-27 DIAGNOSIS — N6489 Other specified disorders of breast: Secondary | ICD-10-CM

## 2016-02-17 ENCOUNTER — Encounter: Payer: Self-pay | Admitting: *Deleted

## 2016-02-17 ENCOUNTER — Ambulatory Visit
Admission: EM | Admit: 2016-02-17 | Discharge: 2016-02-17 | Disposition: A | Discharge status: Home / Self Care | Payer: Managed Care, Other (non HMO) | Attending: Family Medicine | Admitting: Family Medicine

## 2016-02-17 DIAGNOSIS — J0100 Acute maxillary sinusitis, unspecified: Secondary | ICD-10-CM

## 2016-02-17 DIAGNOSIS — J040 Acute laryngitis: Secondary | ICD-10-CM

## 2016-02-17 MED ORDER — HYDROCOD POLST-CPM POLST ER 10-8 MG/5ML PO SUER: 5.0000 mL | Freq: Two times a day (BID) | ORAL | 0 refills | Status: DC | PRN

## 2016-02-17 MED ORDER — AZITHROMYCIN 500 MG PO TABS: ORAL_TABLET | ORAL | 0 refills | Status: DC

## 2016-02-17 NOTE — ED Triage Notes (Signed)
Non-productive cough, sore throat, headache, and ear fullness/ringing x1 week. Denies fever. States previous sinus infections and this is similar to what she has had before.

## 2016-02-17 NOTE — ED Provider Notes (Signed)
[facility]    CSN: [id number] Arrival date & time: 02/17/16  1149     History   Chief Complaint Chief Complaint  Patient presents with  . Headache  . Cough  . Sore Throat  . Ear Fullness    HPI Shannon Klein is a 55 y.o. female.   Patient's here because of nasal congestion and coughing. She states nasal congestion started about a week ago she had sore throat and cough it has progressively gotten worse yesterday she started becoming hoarse and difficult to speaking and she can't sleep at all because of the recurrent postnasal drainage. As far as a sore throat was concerned she denies strep states she's had strep before and this is just irritation from the drainage. She takes Claritin and she is D as well and she is Flonase at home also. Her PCP could not see her today so she came in to be seen today because she states she has has something done with the progression of her illness. Other osteoarthritis she has no medical problems no significant past family medical history mother had ovarian cancer father had hypertension and the several relatives with breast cancer she does not smoke. She is allergic to erythromycin and penicillin. She states that she has had a bilateral oophorectomy, adenoidectomy, and tonsillectomy.      The history is provided by the patient. No language interpreter was used.  Cough  Cough characteristics:  Non-productive Sputum characteristics:  Nondescript Severity:  Moderate Onset quality:  Sudden Timing:  Constant Chronicity:  New Relieved by:  Nothing Associated symptoms: no chest pain, no headaches and no shortness of breath   Sore Throat  Pertinent negatives include no chest pain, no abdominal pain, no headaches and no shortness of breath.  Ear Fullness  This is a new problem. The current episode started more than 2 days ago. The problem occurs constantly. The problem has been gradually worsening. Pertinent negatives include no chest  pain, no abdominal pain, no headaches and no shortness of breath. Nothing aggravates the symptoms. Nothing relieves the symptoms. The treatment provided no relief.    Past Medical History:  Diagnosis Date  . Arthritis    ON FINGERS  . Hyperlipidemia     Patient Active Problem List   Diagnosis Date Noted  . 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 Family history of ovarian cancer 06/19/2011    Past Surgical History:  Procedure Laterality Date  . CESAREAN SECTION     X2  . LASIK     BILATERAL  . OOPHORECTOMY Bilateral 1997  . PELVIC LAPAROSCOPY     BILATERAL OOPHORECTOMY  . TONSILLECTOMY AND ADENOIDECTOMY      OB History    Gravida Para Term Preterm AB Living   2 2 2     2    SAB TAB Ectopic Multiple Live Births           2       Home Medications    Prior to Admission medications   Medication Sig Start Date End Date Taking? Authorizing Provider  estrogens, conjugated, (PREMARIN) 0.3 MG tablet Take one tablet daily 03/22/15  Yes Terrance Mass, MD  fluticasone [facility]) 50 MCG/ACT nasal spray Place into both nostrils daily.   Yes Historical Provider, MD  loratadine (CLARITIN) 10 MG tablet Take 10 mg by mouth daily.   Yes Historical Provider, MD  medroxyPROGESTERone (PROVERA) 10 MG tablet Take 1 tablet (10 mg total) by mouth  daily. 03/22/15  Yes Terrance Mass, MD  meloxicam (MOBIC) 15 MG tablet Take 15 mg by mouth daily.   Yes Historical Provider, MD  Multiple Vitamins-Minerals (HAIR/SKIN/NAILS PO) Take by mouth.   Yes Historical Provider, MD  omeprazole (PRILOSEC) 20 MG capsule Take 20 mg by mouth daily.   Yes Historical Provider, MD  vitamin E 600 UNIT capsule Take 600 Units by mouth daily.   Yes Historical Provider, MD  azithromycin (ZITHROMAX) 500 MG tablet 1 tablet orally daily 5 days 02/17/16   Frederich Cha, MD  Calcium Carbonate-Vitamin D (CALCIUM + D PO) Take by mouth.    Historical Provider, MD  chlorpheniramine-HYDROcodone (TUSSIONEX  PENNKINETIC ER) 10-8 MG/5ML SUER Take 5 mLs by mouth every 12 (twelve) hours as needed for cough. 02/17/16   Frederich Cha, MD    Family History Family History  Problem Relation Age of Onset  . Cancer Mother 50    OVARIAN  . Hypertension Father   . Heart disease Father   . Breast cancer Paternal Aunt   . Breast cancer Cousin     Social History Social History  Substance Use Topics  . Smoking status: Never Smoker  . Smokeless tobacco: Never Used  . Alcohol use 0.0 oz/week     Comment: 2 OR 3 GLASSES A WEEK OF WINE     Allergies   Erythromycin and Penicillins   Review of Systems Review of Systems  HENT: Positive for congestion, postnasal drip, sinus pressure, tinnitus and voice change.   Respiratory: Positive for cough. Negative for shortness of breath.   Cardiovascular: Negative for chest pain.  Gastrointestinal: Negative for abdominal pain.  Neurological: Negative for headaches.  All other systems reviewed and are negative.    Physical Exam Triage Vital Signs ED Triage Vitals  Enc Vitals Group     BP 02/17/16 1216 (!) 132/93     Pulse Rate 02/17/16 1216 87     Resp 02/17/16 1216 16     Temp 02/17/16 1216 98.1 F (36.7 C)     Temp Source 02/17/16 1216 Oral     SpO2 02/17/16 1216 97 %     Weight 02/17/16 1218 132 lb (59.9 kg)     Height 02/17/16 1218 5' (1.524 m)     Head Circumference --      Peak Flow --      Pain Score --      Pain Loc --      Pain Edu? --      Excl. in [place]? --    No data found.   Updated Vital Signs BP (!) 132/93 (BP Location: Left Arm)   Pulse 87   Temp 98.1 F (36.7 C) (Oral)   Resp 16   Ht 5' (1.524 m)   Wt 132 lb (59.9 kg)   LMP 02/07/2016 (Approximate)   SpO2 97%   BMI 25.78 kg/m   Visual Acuity Right Eye Distance:   Left Eye Distance:   Bilateral Distance:    Right Eye Near:   Left Eye Near:    Bilateral Near:     Physical Exam  Constitutional: She is oriented to person, place, and time. She appears  well-developed and well-nourished.  HENT:  Head: Normocephalic and atraumatic.  Right Ear: Hearing, external ear and ear canal normal. Tympanic membrane is bulging.  Left Ear: Hearing, external ear and ear canal normal. Tympanic membrane is bulging.  Nose: Mucosal edema and rhinorrhea present. Left sinus exhibits maxillary sinus tenderness.  Mouth/Throat: Uvula  is midline. Posterior oropharyngeal erythema present.  Cardiovascular: Normal rate.   Pulmonary/Chest: Effort normal and breath sounds normal.  Musculoskeletal: Normal range of motion.  Neurological: She is oriented to person, place, and time.  Skin: Skin is warm.  Psychiatric: She has a normal mood and affect.  Vitals reviewed.    UC Treatments / Results  Labs (all labs ordered are listed, but only abnormal results are displayed) Labs Reviewed - No data to display  EKG  EKG Interpretation None       Radiology No results found.  Procedures Procedures (including critical care time)  Medications Ordered in UC Medications - No data to display   Initial Impression / Assessment and Plan / UC Course  I have reviewed the triage vital signs and the nursing notes.  Pertinent labs & imaging results that were available during my care of the patient were reviewed by me and considered in my medical decision making (see chart for details).  Clinical Course    Patient states that Zithromax worked well for her but instead of a Z-Pak will place her on Zithromax for dose for 5 days 500 mg. She has Claritin and Sudafed at home with her Nasonex. We'll place her on Tussionex for the cough file PCP in about a week not better.  Final Clinical Impressions(s) / UC Diagnoses   Final diagnoses:  Acute non-recurrent maxillary sinusitis  Laryngitis, acute    New Prescriptions New Prescriptions   AZITHROMYCIN (ZITHROMAX) 500 MG TABLET    1 tablet orally daily 5 days   CHLORPHENIRAMINE-HYDROCODONE (TUSSIONEX PENNKINETIC ER) 10-8  MG/5ML SUER    Take 5 mLs by mouth every 12 (twelve) hours as needed for cough.     Frederich Cha, MD 02/17/16 1320

## 2016-03-14 ENCOUNTER — Other Ambulatory Visit: Payer: Self-pay | Admitting: Gynecology

## 2016-03-14 NOTE — Telephone Encounter (Signed)
Annual scheduled on 03/22/16

## 2016-03-22 ENCOUNTER — Ambulatory Visit (INDEPENDENT_AMBULATORY_CARE_PROVIDER_SITE_OTHER): Payer: Managed Care, Other (non HMO) | Admitting: Gynecology

## 2016-03-22 ENCOUNTER — Telehealth: Payer: Self-pay | Admitting: Gynecology

## 2016-03-22 ENCOUNTER — Encounter: Payer: Self-pay | Admitting: Gynecology

## 2016-03-22 VITALS — BP 124/78 | Ht 61.0 in | Wt 131.0 lb

## 2016-03-22 DIAGNOSIS — Z79890 Hormone replacement therapy (postmenopausal): Secondary | ICD-10-CM

## 2016-03-22 DIAGNOSIS — M818 Other osteoporosis without current pathological fracture: Secondary | ICD-10-CM | POA: Diagnosis not present

## 2016-03-22 DIAGNOSIS — E8940 Asymptomatic postprocedural ovarian failure: Secondary | ICD-10-CM | POA: Diagnosis not present

## 2016-03-22 DIAGNOSIS — M810 Age-related osteoporosis without current pathological fracture: Secondary | ICD-10-CM

## 2016-03-22 DIAGNOSIS — Z01411 Encounter for gynecological examination (general) (routine) with abnormal findings: Secondary | ICD-10-CM

## 2016-03-22 NOTE — Telephone Encounter (Signed)
Pam, patient with osteoporosis needs to start Prolia. She is undertreatment for severe GERD and cannot take oral Bis. Calcium

## 2016-03-22 NOTE — Patient Instructions (Signed)
Denosumab injection What is this medicine? DENOSUMAB (den oh sue mab) slows bone breakdown. Prolia is used to treat osteoporosis in women after menopause and in men. Xgeva is used to prevent bone fractures and other bone problems caused by cancer bone metastases. Xgeva is also used to treat giant cell tumor of the bone. This medicine may be used for other purposes; ask your health care provider or pharmacist if you have questions. What should I tell my health care provider before I take this medicine? They need to know if you have any of these conditions: -dental disease -eczema -infection or history of infections -kidney disease or on dialysis -low blood calcium or vitamin D -malabsorption syndrome -scheduled to have surgery or tooth extraction -taking medicine that contains denosumab -thyroid or parathyroid disease -an unusual reaction to denosumab, other medicines, foods, dyes, or preservatives -pregnant or trying to get pregnant -breast-feeding How should I use this medicine? This medicine is for injection under the skin. It is given by a health care professional in a hospital or clinic setting. If you are getting Prolia, a special MedGuide will be given to you by the pharmacist with each prescription and refill. Be sure to read this information carefully each time. For Prolia, talk to your pediatrician regarding the use of this medicine in children. Special care may be needed. For Xgeva, talk to your pediatrician regarding the use of this medicine in children. While this drug may be prescribed for children as young as 13 years for selected conditions, precautions do apply. Overdosage: If you think you have taken too much of this medicine contact a poison control center or emergency room at once. NOTE: This medicine is only for you. Do not share this medicine with others. What if I miss a dose? It is important not to miss your dose. Call your doctor or health care professional if you are  unable to keep an appointment. What may interact with this medicine? Do not take this medicine with any of the following medications: -other medicines containing denosumab This medicine may also interact with the following medications: -medicines that suppress the immune system -medicines that treat cancer -steroid medicines like prednisone or cortisone This list may not describe all possible interactions. Give your health care provider a list of all the medicines, herbs, non-prescription drugs, or dietary supplements you use. Also tell them if you smoke, drink alcohol, or use illegal drugs. Some items may interact with your medicine. What should I watch for while using this medicine? Visit your doctor or health care professional for regular checks on your progress. Your doctor or health care professional may order blood tests and other tests to see how you are doing. Call your doctor or health care professional if you get a cold or other infection while receiving this medicine. Do not treat yourself. This medicine may decrease your body's ability to fight infection. You should make sure you get enough calcium and vitamin D while you are taking this medicine, unless your doctor tells you not to. Discuss the foods you eat and the vitamins you take with your health care professional. See your dentist regularly. Brush and floss your teeth as directed. Before you have any dental work done, tell your dentist you are receiving this medicine. Do not become pregnant while taking this medicine or for 5 months after stopping it. Women should inform their doctor if they wish to become pregnant or think they might be pregnant. There is a potential for serious side effects   to an unborn child. Talk to your health care professional or pharmacist for more information. What side effects may I notice from receiving this medicine? Side effects that you should report to your doctor or health care professional as soon as  possible: -allergic reactions like skin rash, itching or hives, swelling of the face, lips, or tongue -breathing problems -chest pain -fast, irregular heartbeat -feeling faint or lightheaded, falls -fever, chills, or any other sign of infection -muscle spasms, tightening, or twitches -numbness or tingling -skin blisters or bumps, or is dry, peels, or red -slow healing or unexplained pain in the mouth or jaw -unusual bleeding or bruising Side effects that usually do not require medical attention (Report these to your doctor or health care professional if they continue or are bothersome.): -muscle pain -stomach upset, gas This list may not describe all possible side effects. Call your doctor for medical advice about side effects. You may report side effects to FDA at 1-800-FDA-1088. Where should I keep my medicine? This medicine is only given in a clinic, doctor's office, or other health care setting and will not be stored at home. NOTE: This sheet is a summary. It may not cover all possible information. If you have questions about this medicine, talk to your doctor, pharmacist, or health care provider.    2016, Elsevier/Gold Standard. (2011-10-30 12:37:47) Osteoporosis Osteoporosis is the thinning and loss of density in the bones. Osteoporosis makes the bones more brittle, fragile, and likely to break (fracture). Over time, osteoporosis can cause the bones to become so weak that they fracture after a simple fall. The bones most likely to fracture are the bones in the hip, wrist, and spine. CAUSES  The exact cause is not known. RISK FACTORS Anyone can develop osteoporosis. You may be at greater risk if you have a family history of the condition or have poor nutrition. You may also have a higher risk if you are:   Female.   50 years old or older.  A smoker.  Not physically active.   White or Asian.  Slender. SIGNS AND SYMPTOMS  A fracture might be the first sign of the  disease, especially if it results from a fall or injury that would not usually cause a bone to break. Other signs and symptoms include:   Low back and neck pain.  Stooped posture.  Height loss. DIAGNOSIS  To make a diagnosis, your health care provider may:  Take a medical history.  Perform a physical exam.  Order tests, such as:  A bone mineral density test.  A dual-energy X-ray absorptiometry test. TREATMENT  The goal of osteoporosis treatment is to strengthen your bones to reduce your risk of a fracture. Treatment may involve:  Making lifestyle changes, such as:  Eating a diet rich in calcium.  Doing weight-bearing and muscle-strengthening exercises.  Stopping tobacco use.  Limiting alcohol intake.  Taking medicine to slow the process of bone loss or to increase bone density.  Monitoring your levels of calcium and vitamin D. HOME CARE INSTRUCTIONS  Include calcium and vitamin D in your diet. Calcium is important for bone health, and vitamin D helps the body absorb calcium.  Perform weight-bearing and muscle-strengthening exercises as directed by your health care provider.  Do not use any tobacco products, including cigarettes, chewing tobacco, and electronic cigarettes. If you need help quitting, ask your health care provider.  Limit your alcohol intake.  Take medicines only as directed by your health care provider.  Keep all follow-up   visits as directed by your health care provider. This is important.  Take precautions at home to lower your risk of falling, such as:  Keeping rooms well lit and clutter free.  Installing safety rails on stairs.  Using rubber mats in the bathroom and other areas that are often wet or slippery. SEEK IMMEDIATE MEDICAL CARE IF:  You fall or injure yourself.    This information is not intended to replace advice given to you by your health care provider. Make sure you discuss any questions you have with your health care  provider.   Document Released: 02/08/2005 Document Revised: 05/22/2014 Document Reviewed: 10/09/2013 Elsevier Interactive Patient Education 2016 Elsevier Inc.  

## 2016-03-22 NOTE — Progress Notes (Signed)
Shannon Klein [birth year] [id number]   History:    55 y.o.  for annual gyn exam who on bone density study last year was found to be osteoporotic with a T score -2.7 at the [facility] and she did not return for consultation to discuss treatment options. She states she is taking her calcium and vitamin D. She does have history of surgical menopause whereby back in 1997 she had prophylactic bilateral salpingo-oophorectomy due to mother's history of ovarian cancer at the age of 51. Patient has been on hormone replacement therapy since that time and we have been taping her down. She was on 0.9 mg of Premarin daily with the addition of Provera 10 mg for 10 days a month. she is now on Premarin 0.3 mg daily with the addition of Provera 10 mg for 10 days of the month. Patient reports very little once a month spotting not lasting more than one day. Her PCP has been doing her blood work. Patient does have history of gastroesophageal reflux for which she takes medications.  Patient had been offered BRCA one BRCA2 testing as well as genetic counseling but has refused. She does have several aunts with breast cancer but are on her father's side.  Patient has received the Tdap vaccine the past. Patient refuses flu vaccine today. Patient had a normal colonoscopy in 2012.  Past medical history,surgical history, family history and social history were all reviewed and documented in the EPIC chart.  Gynecologic History No LMP recorded. Patient is not currently having periods (Reason: Perimenopausal). Contraception: tubal ligation Last Pap: 2014. Results were: normal Last mammogram: 2017. Results were: Normal but dense  Obstetric History OB History  Gravida Para Term Preterm AB Living  2 2 2     2   SAB TAB Ectopic Multiple Live Births          2    # Outcome Date GA Lbr Len/2nd Weight Sex Delivery Anes PTL Lv  2 Term     M CS-Unspec  N LIV  1 Term     M CS-Unspec  N LIV       ROS: A ROS was performed  and pertinent positives and negatives are included in the history.  GENERAL: No fevers or chills. HEENT: No change in vision, no earache, sore throat or sinus congestion. NECK: No pain or stiffness. CARDIOVASCULAR: No chest pain or pressure. No palpitations. PULMONARY: No shortness of breath, cough or wheeze. GASTROINTESTINAL: No abdominal pain, nausea, vomiting or diarrhea, melena or bright red blood per rectum. GENITOURINARY: No urinary frequency, urgency, hesitancy or dysuria. MUSCULOSKELETAL: No joint or muscle pain, no back pain, no recent trauma. DERMATOLOGIC: No rash, no itching, no lesions. ENDOCRINE: No polyuria, polydipsia, no heat or cold intolerance. No recent change in weight. HEMATOLOGICAL: No anemia or easy bruising or bleeding. NEUROLOGIC: No headache, seizures, numbness, tingling or weakness. PSYCHIATRIC: No depression, no loss of interest in normal activity or change in sleep pattern.     Exam: chaperone present  BP 124/78   Ht 5' 1"  (1.549 m)   Wt 131 lb (59.4 kg)   BMI 24.75 kg/m   Body mass index is 24.75 kg/m.  General appearance : Well developed well nourished female. No acute distress HEENT: Eyes: no retinal hemorrhage or exudates,  Neck supple, trachea midline, no carotid bruits, no thyroidmegaly Lungs: Clear to auscultation, no rhonchi or wheezes, or rib retractions  Heart: Regular rate and rhythm, no murmurs or gallops Breast:Examined in sitting and  supine position were symmetrical in appearance, no palpable masses or tenderness,  no skin retraction, no nipple inversion, no nipple discharge, no skin discoloration, no axillary or supraclavicular lymphadenopathy Abdomen: no palpable masses or tenderness, no rebound or guarding Extremities: no edema or skin discoloration or tenderness  Pelvic:  Bartholin, Urethra, Skene Glands: Within normal limits             Vagina: No gross lesions or discharge  Cervix: No gross lesions or discharge  Uterus  anteverted, normal  size, shape and consistency, non-tender and mobile  Adnexa  Without masses or tenderness  Anus and perineum  normal   Rectovaginal  normal sphincter tone without palpated masses or tenderness             Hemoccult cards will be provided     Assessment/Plan:  55 y.o. female for annual exam with history of osteoporosis based on bone density study done in 2016 whereby her lowest T score was -2.7 at the [facility]. We had a lengthy discussion on osteoporosis in different treatment options. Because of her history of gastroesophageal reflux she would not be a candidate for oral bisphosphonate and for this reason I'm recommended that she begin Prolia 60 mg subcutaneous every 6 months. Risk benefits and pros and cons were discussed literature information was provided. We'll scheduled the next few weeks. She'll stop by the lab so we can check her calcium, vitamin D level as well as PTH level. Pap smear was done today. Patient was reminded next year which she gets her mammogram she needs a three-dimensional mammogram because her breasts were very dense. Fecal Hemoccult cards were provided for her to cemented to the office for testing. Her PCP is doing the remainder of her blood work. She did declined flu vaccine today.   Terrance Mass MD, 1:07 PM 03/22/2016

## 2016-03-23 LAB — PTH, INTACT AND CALCIUM
Calcium: 9.5 mg/dL (ref 8.6–10.4)
PTH: 26 pg/mL (ref 14–64)

## 2016-03-23 LAB — VITAMIN D 25 HYDROXY (VIT D DEFICIENCY, FRACTURES): VIT D 25 HYDROXY: 47 ng/mL (ref 30–100)

## 2016-03-27 LAB — PAP IG W/ RFLX HPV ASCU

## 2016-04-11 NOTE — Telephone Encounter (Signed)
pc to pt waiting on insurance benefits from Prolia   Calcium level 03/22/16  9.5 . History of GERD

## 2016-04-18 NOTE — Telephone Encounter (Addendum)
Received insurance  Deductible $4000 [phone] met)  Balance [phone] for deductible, 10% copay Prolia $210 after deductible has been met OOPM  $6000 (2486 met).  Calcium level 9.5  03/22/16 PA needed. Prolia card benefit $1500 year.

## 2016-04-20 NOTE — Telephone Encounter (Addendum)
prolia card received [id number]  Vin [id number], Pt request that I send her a written insurance information . I explained that this information is approx. I also will mail her the AVS . Talked with patient regarding Prolia benefits , I have sent information to insurance for PA and asked [facility] to verify benefits again. I also want Sharrie Rothman to review. Deduc and co pay  I will call pt next week

## 2016-04-24 ENCOUNTER — Other Ambulatory Visit: Payer: Self-pay | Admitting: Gynecology

## 2016-04-24 MED ORDER — MEDROXYPROGESTERONE ACETATE 10 MG PO TABS: ORAL_TABLET | ORAL | 2 refills | Status: DC

## 2016-05-01 NOTE — Telephone Encounter (Signed)
Insurance denied for Prolia. Appeal letter from Dr Toney Rakes has been sent to insurance company. Will notify patient.

## 2016-05-02 NOTE — Telephone Encounter (Signed)
Waiting for appeal letter, patient wanted to make appointment on 12/29 at 11 am in the event she is approved. I told her that Sharrie Rothman will follow up with her to verify approval. She has received her Prolia benefit card. I will check back with her on 05/04/16

## 2016-05-04 NOTE — Telephone Encounter (Signed)
Talked with [name] and explained that she cannot come in Prolia on Friday if no approval from insurance. Must have a verbal ok. She will call prior to appointment if she does not get a phone call from [place]. Sharrie Rothman will be the F/U for this next week.

## 2016-05-12 ENCOUNTER — Ambulatory Visit: Payer: Managed Care, Other (non HMO)

## 2016-05-16 NOTE — Telephone Encounter (Signed)
PC to patient, no answer from insurance com. I will reach out to them to follow up. Pt also stated that she had an episode of GERD with severe GI upset due to forgetting her daily RX medication. She did state that she has a GI MD with this documentation for the GERD issue

## 2016-05-23 NOTE — Telephone Encounter (Signed)
Called [employer] due to no answer from appeal letter from 05/01/16. Informed by [name] at [place] appeal letter denied. I will inform Dr Toney Rakes and ask next step for patient.

## 2016-05-24 NOTE — Telephone Encounter (Signed)
See if you can get me on the phone with her insurance company to repeal this decision

## 2016-05-30 NOTE — Telephone Encounter (Signed)
I will ask Shannon Klein to help with this so that Dr Toney Rakes can talk to the appeal MD.

## 2016-06-05 NOTE — Telephone Encounter (Signed)
The number for the appeals line is [phone], you may call them when you get a minute to speak with them regarding the appeal. Dr.Michael Gross

## 2016-06-06 MED ORDER — ALENDRONATE SODIUM 70 MG PO TABS: 70.0000 mg | ORAL_TABLET | ORAL | 11 refills | Status: AC

## 2016-06-06 NOTE — Telephone Encounter (Signed)
Insurance benefits [place] $4000 ($3030 met),  10% co insurance $210. Total for pt $970 deductible and $210 total cost $1180.00  Calcium 9.5  03/22/16  Complete exam 03/2016  . Prolia card approved. $1500 benefit. Insurance coverage 10/14/2015 thru 10/12/16.  PA approval ref # [id number]   Valid 04/18/16  Thru 12/015/18   OOPM  $6000 ($3030 met)  Pt concerned about cost and second injection cost. She will try Fosamax per Dr Toney Rakes Suggestion. In the event she has problems with her current GERD she will inform the office. DR Toney Rakes also recommends Reclast so I will check her benefits after she has a trial of the Fosamax.

## 2016-06-13 ENCOUNTER — Telehealth: Payer: Self-pay | Admitting: *Deleted

## 2016-06-13 NOTE — Telephone Encounter (Signed)
She take Advil, ibuprofen let's monitor next few weeks. If it continues wanting to see her to possibly change therapy unless this is viral or flu coming on

## 2016-06-13 NOTE — Telephone Encounter (Signed)
Pt informed with the below note. 

## 2016-06-13 NOTE — Telephone Encounter (Signed)
Pt was prescribed Fosamax 70mg  weekly, took 1st pill yesterday and done well, no GERD issues. Today pt said it was difficult to get out of bed, c/o join/back pain, asked if this normal? Would you recommend she take something OTC to help with discomfort? Please advise

## 2016-08-09 NOTE — Telephone Encounter (Signed)
Benefits for Reclast checked. Ref # [id number] Tessa 07/18/16  No PA required . Pt has $400 family deductible. OOPM $6000, [id number] met. Medicare allow $735 [id number] and [phone] [id number]. Pt deductible remaining $543.53  90/10 plan plus 10% 30.00 Total cost for pt approx 573.59 . Called pt she will not take oral meds due to Pt was prescribed Fosamax 59m weekly, took 1st pill yesterday and done well, no GERD issues. Today pt said it was difficult to get out of bed, c/o join/back pain, asked if this normal? Would you recommend she take something OTC to help with discomfort?  This note was after one dose of oral medication . Patient also today does not want me to check benefits on Prolia. She will continue her calcium and Vit D with heathy diet.

## 2016-09-27 ENCOUNTER — Encounter: Payer: Self-pay | Admitting: Gynecology

## 2016-10-03 IMAGING — MG MM DIGITAL DIAGNOSTIC UNILAT*R* W/ TOMO W/ CAD
6 series · 6 of 14 positions shown · non-contrast
Comparison: Previous exam(s).

CLINICAL DATA: Right breast slightly upper slightly inner quadrant
focal asymmetry seen on most recent screening mammography.

EXAM:
2D DIGITAL DIAGNOSTIC RIGHT MAMMOGRAM WITH CAD AND ADJUNCT TOMO
ULTRASOUND RIGHT BREAST

[R CC]
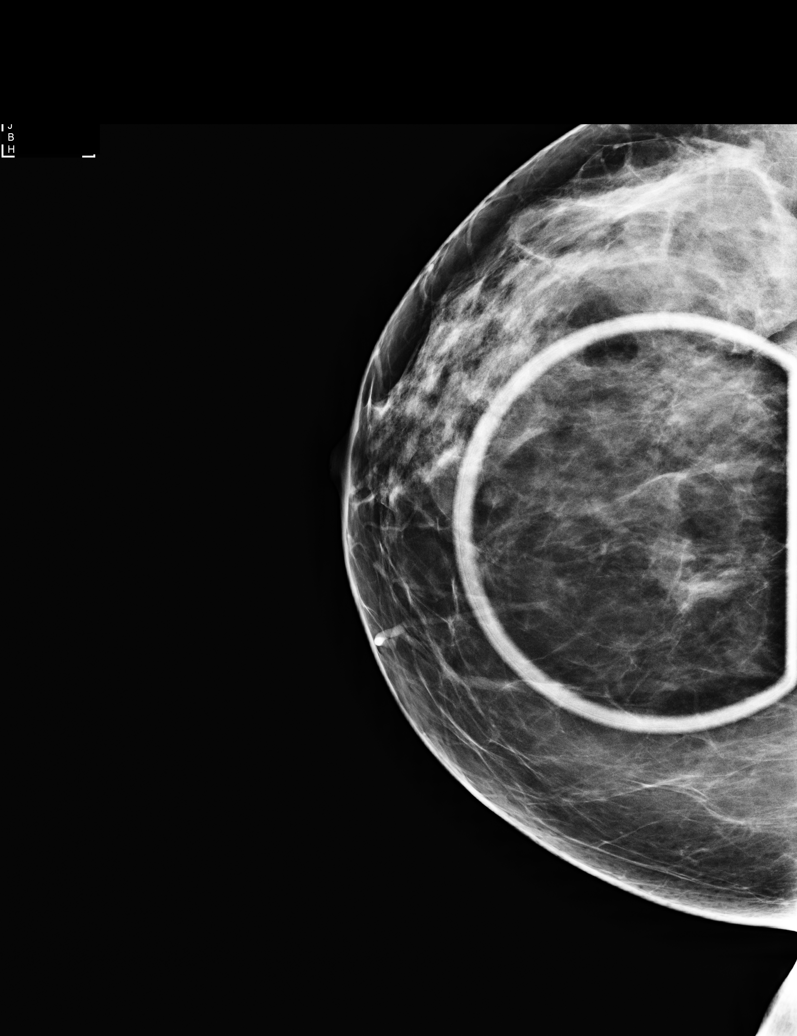

[R MLO synth-2D]
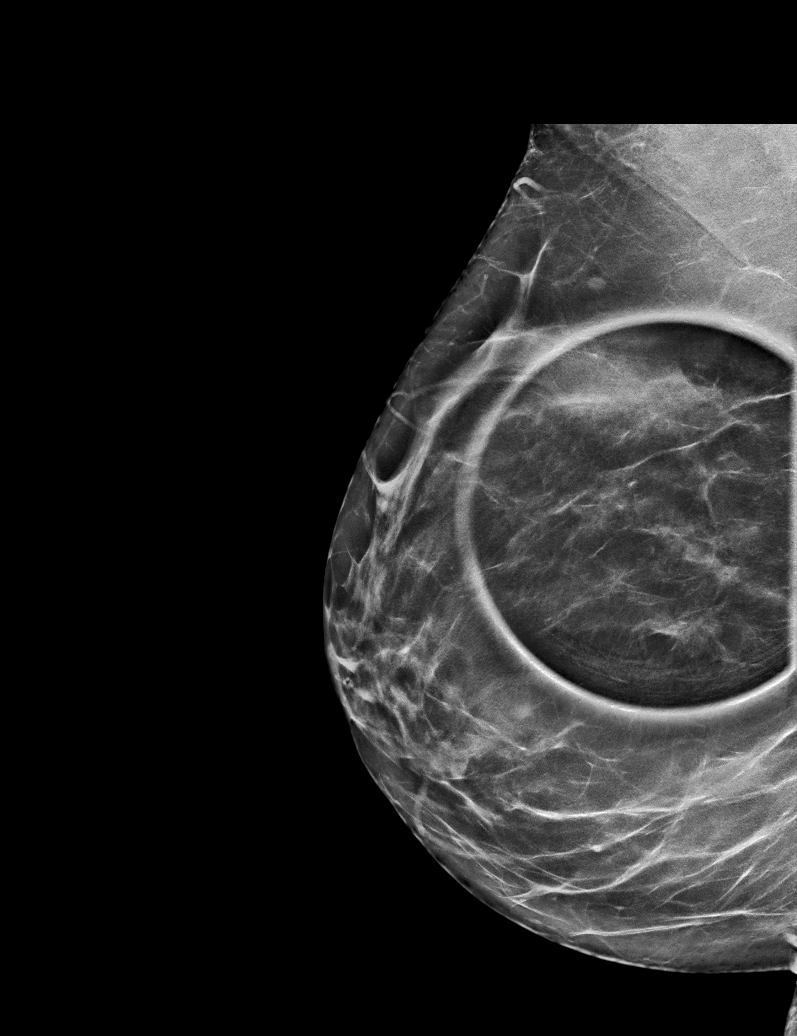

[R CC synth-2D]
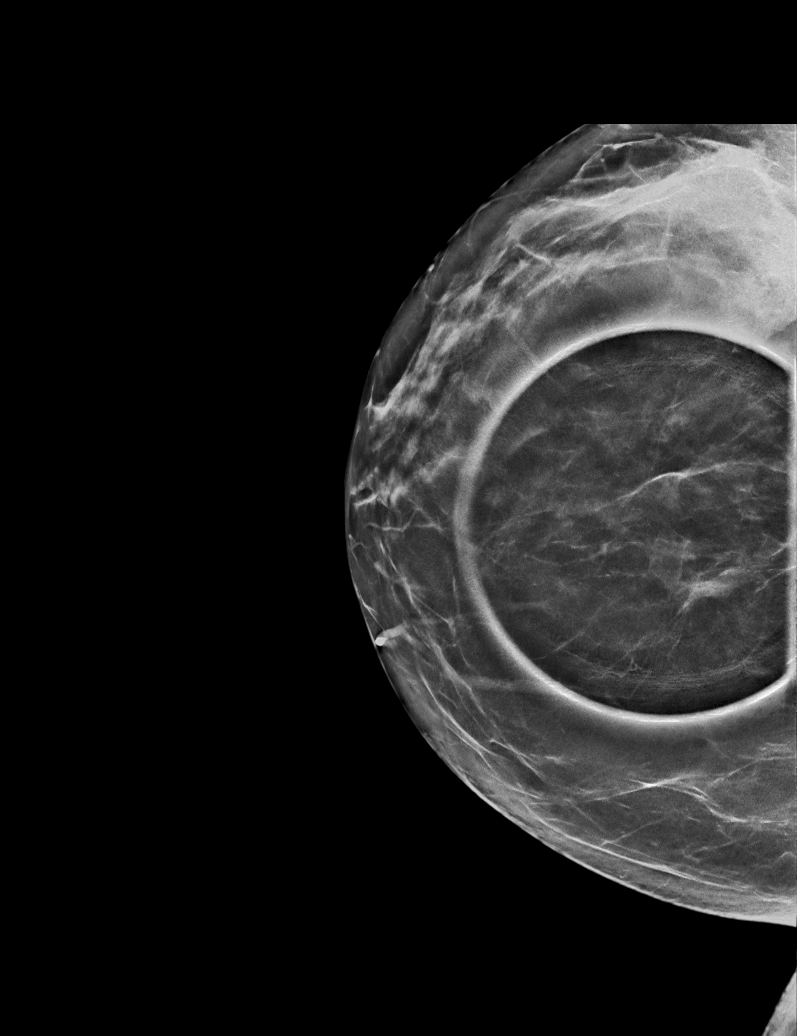

[R MLO]
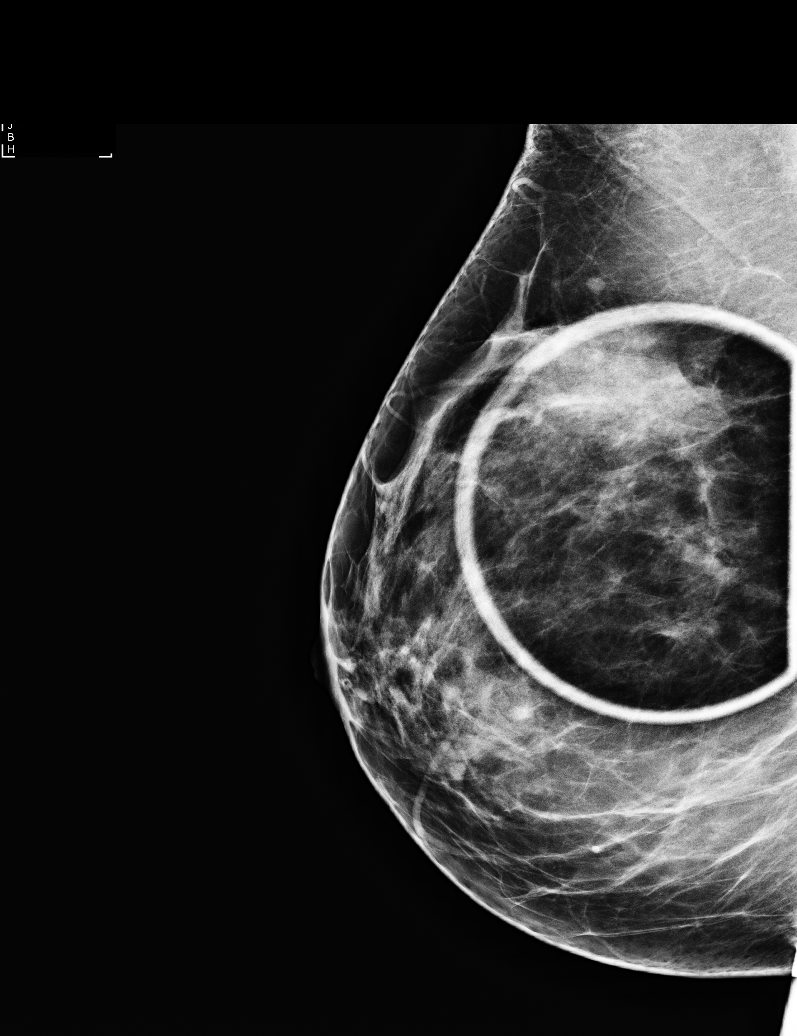

[R CC tomo · tomo slice 33/64.0]
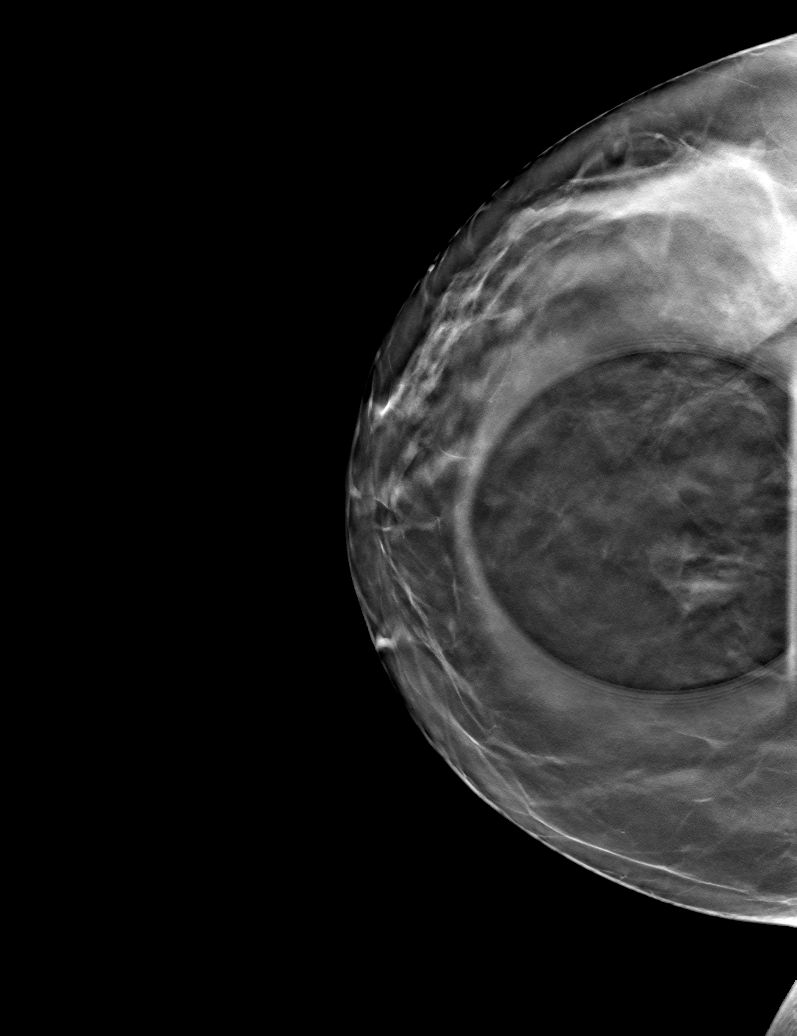

[R MLO tomo · tomo slice 33/65.0]
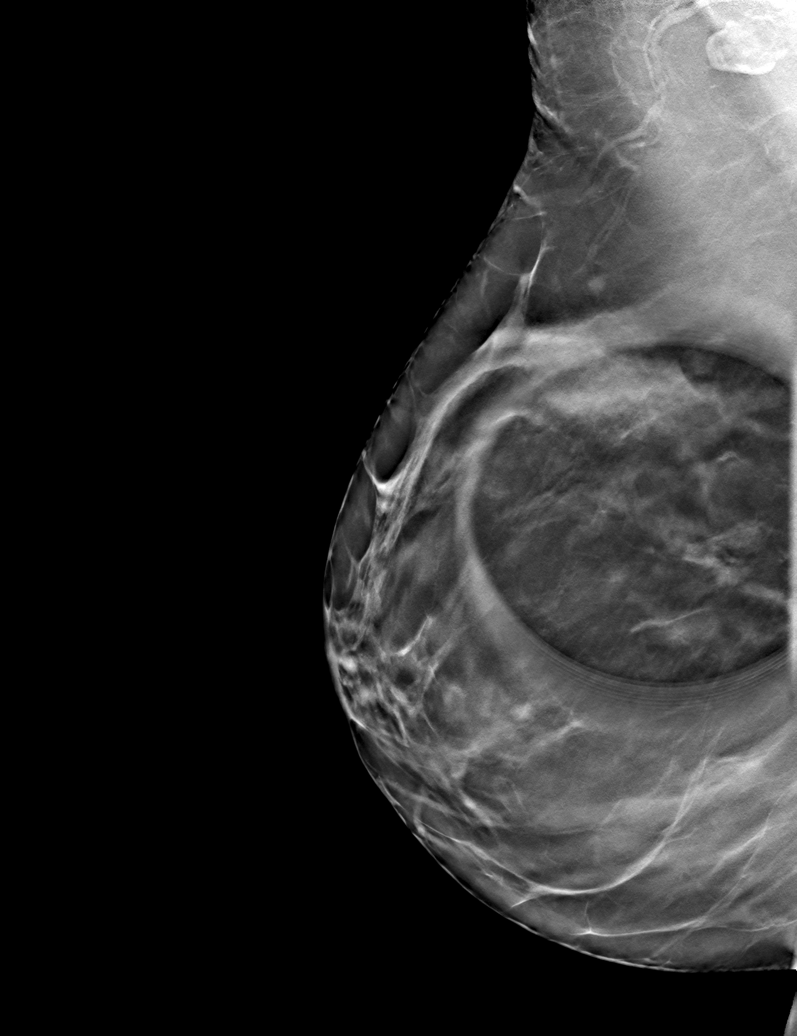

[6 of 14 positions shown; findings below may reference images not displayed]

ACR Breast Density Category c: The breast tissue is heterogeneously
dense, which may obscure small masses.
FINDINGS: Additional mammographic views of the right breast demonstrate
interval effacement of previously seen asymmetry in the slightly
upper slightly inner quadrant of the right breast, middle depth.

Mammographic images were processed with CAD.

On physical exam, no suspicious palpable masses.

Targeted ultrasound is performed, showing no suspicious masses or
shadowing lesions.
IMPRESSION: No mammographic or sonographic evidence of malignancy in the right
breast.

RECOMMENDATION:
Screening mammogram in one year.(Code:47-[id number])

I have discussed the findings and recommendations with the patient.
Results were also provided in writing at the conclusion of the
visit. If applicable, a reminder letter will be sent to the patient
regarding the next appointment.

BI-RADS CATEGORY  1: Negative.

## 2017-05-25 ENCOUNTER — Telehealth: Payer: Self-pay | Admitting: *Deleted

## 2017-05-25 DIAGNOSIS — M810 Age-related osteoporosis without current pathological fracture: Secondary | ICD-10-CM

## 2017-05-25 NOTE — Telephone Encounter (Signed)
Patient asked when last dexa, done in 12/16, due in 12/18. Has annual scheduled with Dr.Lavoie on 07/12/17, order placed. Pt will schedule.

## 2017-06-06 ENCOUNTER — Other Ambulatory Visit: Payer: Self-pay | Admitting: Gynecology

## 2017-06-06 DIAGNOSIS — M810 Age-related osteoporosis without current pathological fracture: Secondary | ICD-10-CM

## 2017-06-19 ENCOUNTER — Ambulatory Visit (INDEPENDENT_AMBULATORY_CARE_PROVIDER_SITE_OTHER): Payer: Managed Care, Other (non HMO)

## 2017-06-19 DIAGNOSIS — M810 Age-related osteoporosis without current pathological fracture: Secondary | ICD-10-CM | POA: Diagnosis not present

## 2017-06-20 ENCOUNTER — Telehealth: Payer: Self-pay | Admitting: Gynecology

## 2017-06-20 NOTE — Telephone Encounter (Signed)
Tell patient her most recent bone density again shows osteoporosis.  Overall stable from her prior study.  She is at increased risk of fracture based on the study.  I know she has talked to Dr Dellis Filbert about treatment options and I encouraged her to consider proceeding with the Prolia as they had talked about.  I would recommend she follow-up with Dr Dellis Filbert she has any further questions regarding this.

## 2017-06-21 NOTE — Telephone Encounter (Signed)
Pt informed

## 2017-07-12 ENCOUNTER — Ambulatory Visit (INDEPENDENT_AMBULATORY_CARE_PROVIDER_SITE_OTHER): Payer: Managed Care, Other (non HMO) | Admitting: Obstetrics & Gynecology

## 2017-07-12 ENCOUNTER — Telehealth: Payer: Self-pay | Admitting: Gynecology

## 2017-07-12 ENCOUNTER — Encounter: Payer: Self-pay | Admitting: Obstetrics & Gynecology

## 2017-07-12 VITALS — BP 134/80 | Ht 61.0 in | Wt 130.0 lb

## 2017-07-12 DIAGNOSIS — M810 Age-related osteoporosis without current pathological fracture: Secondary | ICD-10-CM

## 2017-07-12 DIAGNOSIS — Z780 Asymptomatic menopausal state: Secondary | ICD-10-CM | POA: Diagnosis not present

## 2017-07-12 DIAGNOSIS — Z90722 Acquired absence of ovaries, bilateral: Secondary | ICD-10-CM | POA: Diagnosis not present

## 2017-07-12 DIAGNOSIS — Z01419 Encounter for gynecological examination (general) (routine) without abnormal findings: Secondary | ICD-10-CM | POA: Diagnosis not present

## 2017-07-12 NOTE — Telephone Encounter (Signed)
[name], MD  [name], [name]        Osteoporosis T-Score -2.8 at spine. Intolerance to Biphosphanates in 2017. Start Prolia. Ca++/Vit D drawn today.    Note From Dr Dellis Filbert,. [name] was denied Prolia in 2018 due to no oral meds had been tried. She starte Fosamax and was unable to tolerate due to GERD and muscle/bone pain intense. I will forward this to [place] A to check benefits.

## 2017-07-12 NOTE — Patient Instructions (Signed)
1. Encounter for routine gynecological examination with Papanicolaou smear of cervix Normal gynecologic exam status post BSO.  Reflex Pap done today.  Breast exam normal.  Patient will schedule screening mammogram now.  Health labs with family physician.  2. S/P BSO (bilateral salpingo-oophorectomy) Status post BSO at age 57.  Mother with history of ovarian cancer.  3. Menopause present Well on no hormone replacement therapy.  Stopped hormone replacement therapy last year after more than 20 years of use.  No postmenopausal bleeding.  4. Age-related osteoporosis without current pathological fracture Last bone density February 2019 showing osteoporosis again.  T score at -2.8 at the spine.  Patient had major side effects and did not tolerate biphosphonates, Fosamax, in 2017.  Recommend Prolia and patient agrees with starting treatment.  Usage, risks and benefits reviewed.  Will do a calcium and vitamin D levels today.  Will verify insurance coverage before starting Prolia.  Recommend continuing with regular weightbearing physical activity, vitamin D supplements and calcium rich nutrition. - Calcium - Vitamin D 1,25 dihydroxy  Shannon Klein, it was a pleasure meeting you today!  I will inform you of your results as soon as they are available.   Osteoporosis Osteoporosis is the thinning and loss of density in the bones. Osteoporosis makes the bones more brittle, fragile, and likely to break (fracture). Over time, osteoporosis can cause the bones to become so weak that they fracture after a simple fall. The bones most likely to fracture are the bones in the hip, wrist, and spine. What are the causes? The exact cause is not known. What increases the risk? Anyone can develop osteoporosis. You may be at greater risk if you have a family history of the condition or have poor nutrition. You may also have a higher risk if you are:  Female.  37 years old or older.  A smoker.  Not physically  active.  White or Asian.  Slender.  What are the signs or symptoms? A fracture might be the first sign of the disease, especially if it results from a fall or injury that would not usually cause a bone to break. Other signs and symptoms include:  Low back and neck pain.  Stooped posture.  Height loss.  How is this diagnosed? To make a diagnosis, your health care provider may:  Take a medical history.  Perform a physical exam.  Order tests, such as: ? A bone mineral density test. ? A dual-energy X-ray absorptiometry test.  How is this treated? The goal of osteoporosis treatment is to strengthen your bones to reduce your risk of a fracture. Treatment may involve:  Making lifestyle changes, such as: ? Eating a diet rich in calcium. ? Doing weight-bearing and muscle-strengthening exercises. ? Stopping tobacco use. ? Limiting alcohol intake.  Taking medicine to slow the process of bone loss or to increase bone density.  Monitoring your levels of calcium and vitamin D.  Follow these instructions at home:  Include calcium and vitamin D in your diet. Calcium is important for bone health, and vitamin D helps the body absorb calcium.  Perform weight-bearing and muscle-strengthening exercises as directed by your health care provider.  Do not use any tobacco products, including cigarettes, chewing tobacco, and electronic cigarettes. If you need help quitting, ask your health care provider.  Limit your alcohol intake.  Take medicines only as directed by your health care provider.  Keep all follow-up visits as directed by your health care provider. This is important.  Take precautions at  home to lower your risk of falling, such as: ? Keeping rooms well lit and clutter free. ? Installing safety rails on stairs. ? Using rubber mats in the bathroom and other areas that are often wet or slippery. Get help right away if: You fall or injure yourself. This information is not  intended to replace advice given to you by your health care provider. Make sure you discuss any questions you have with your health care provider. Document Released: 02/08/2005 Document Revised: 10/04/2015 Document Reviewed: 10/09/2013 Elsevier Interactive Patient Education  [employer].

## 2017-07-12 NOTE — Addendum Note (Signed)
Addended by: Thurnell Garbe A on: 07/12/2017 11:35 AM   Modules accepted: Orders

## 2017-07-12 NOTE — Progress Notes (Signed)
Shannon Klein 1961-03-04 [id number]   History:    57 y.o. G2P2L2 Married  RP:  Established patient presenting for annual gyn exam   HPI: S/P BSO at 57 yo, prophylaxis because of mother with Ovarian Ca.  On HRT from that time until last year.  Mild occasional hot flushes, well tolerated.  No PMB.  No pelvic pain.  Urine/BMs wnl.  Breasts wnl.  BMI 24.56.  Health labs with Fam MD.  Past medical history,surgical history, family history and social history were all reviewed and documented in the EPIC chart.  Gynecologic History Patient's last menstrual period was 02/07/2016 (approximate). Contraception: tubal ligation/BSO Last Pap: 03/2016. Results were: Negative Last mammogram: 12/2015 . Results were: Negative Bone Density: 06/2017 Osteoporosis Spine T-score -2.8 (was at -2.7 in 2017).  Not on treatment. Colonoscopy: 2012  Obstetric History OB History  Gravida Para Term Preterm AB Living  2 2 2     2   SAB TAB Ectopic Multiple Live Births          2    # Outcome Date GA Lbr Len/2nd Weight Sex Delivery Anes PTL Lv  2 Term     M CS-Unspec  N LIV  1 Term     M CS-Unspec  N LIV       ROS: A ROS was performed and pertinent positives and negatives are included in the history.  GENERAL: No fevers or chills. HEENT: No change in vision, no earache, sore throat or sinus congestion. NECK: No pain or stiffness. CARDIOVASCULAR: No chest pain or pressure. No palpitations. PULMONARY: No shortness of breath, cough or wheeze. GASTROINTESTINAL: No abdominal pain, nausea, vomiting or diarrhea, melena or bright red blood per rectum. GENITOURINARY: No urinary frequency, urgency, hesitancy or dysuria. MUSCULOSKELETAL: No joint or muscle pain, no back pain, no recent trauma. DERMATOLOGIC: No rash, no itching, no lesions. ENDOCRINE: No polyuria, polydipsia, no heat or cold intolerance. No recent change in weight. HEMATOLOGICAL: No anemia or easy bruising or bleeding. NEUROLOGIC: No headache, seizures,  numbness, tingling or weakness. PSYCHIATRIC: No depression, no loss of interest in normal activity or change in sleep pattern.     Exam:   BP 130/84   Ht 5\' 1"  (1.549 m)   Wt 130 lb (59 kg)   LMP 02/07/2016 (Approximate)   BMI 24.56 kg/m   Body mass index is 24.56 kg/m.  General appearance : Well developed well nourished female. No acute distress HEENT: Eyes: no retinal hemorrhage or exudates,  Neck supple, trachea midline, no carotid bruits, no thyroidmegaly Lungs: Clear to auscultation, no rhonchi or wheezes, or rib retractions  Heart: Regular rate and rhythm, no murmurs or gallops Breast:Examined in sitting and supine position were symmetrical in appearance, no palpable masses or tenderness,  no skin retraction, no nipple inversion, no nipple discharge, no skin discoloration, no axillary or supraclavicular lymphadenopathy Abdomen: no palpable masses or tenderness, no rebound or guarding Extremities: no edema or skin discoloration or tenderness  Pelvic: Vulva: Normal             Vagina: No gross lesions or discharge  Cervix: No gross lesions or discharge.  Pap reflex done.  Uterus  AV, normal size, shape and consistency, non-tender and mobile  Adnexa  Without masses or tenderness  Anus: Normal   Assessment/Plan:  57 y.o. female for annual exam   1. Encounter for routine gynecological examination with Papanicolaou smear of cervix Normal gynecologic exam status post BSO.  Reflex Pap done today.  Breast exam normal.  Patient will schedule screening mammogram now.  Health labs with family physician.  2. S/P BSO (bilateral salpingo-oophorectomy) Status post BSO at age 45.  Mother with history of ovarian cancer.  3. Menopause present Well on no hormone replacement therapy.  Stopped hormone replacement therapy last year after more than 20 years of use.  No postmenopausal bleeding.  4. Age-related osteoporosis without current pathological fracture Last bone density February 2019  showing osteoporosis again.  T score at -2.8 at the spine.  Patient had major side effects and did not tolerate biphosphonates, Fosamax, in 2017.  Recommend Prolia and patient agrees with starting treatment.  Usage, risks and benefits reviewed.  Will do a calcium and vitamin D levels today.  Will verify insurance coverage before starting Prolia.  Recommend continuing with regular weightbearing physical activity, vitamin D supplements and calcium rich nutrition. - Calcium - Vitamin D 1,25 dihydroxy  Counseling on above issues more than 50% for 10 minutes.  [name] MD, 10:48 AM 07/12/2017

## 2017-07-13 ENCOUNTER — Encounter: Payer: Self-pay | Admitting: *Deleted

## 2017-07-13 NOTE — Telephone Encounter (Signed)
Prolia insurance verification has been sent awaiting Summary of benefits  

## 2017-07-15 LAB — VITAMIN D 1,25 DIHYDROXY
Vitamin D 1, 25 (OH)2 Total: 38 pg/mL (ref 18–72)
Vitamin D3 1, 25 (OH)2: 38 pg/mL

## 2017-07-15 LAB — CALCIUM: CALCIUM: 10.2 mg/dL (ref 8.6–10.4)

## 2017-07-16 LAB — PAP IG W/ RFLX HPV ASCU

## 2017-07-16 NOTE — Telephone Encounter (Signed)
Deductible Amount Met  OOP MAX Amount Met  Annual exam 07/02/17  Calcium 10.2       Date 07/12/17  Upcoming dental procedures   Prior Authorization needed YES approved reference #[id number]   Pt estimated Cost? Family plan      Coverage Details:

## 2017-07-17 ENCOUNTER — Encounter: Payer: Self-pay | Admitting: *Deleted

## 2017-07-17 NOTE — Telephone Encounter (Addendum)
Pt aware of Prior Auth in process will contact pt when it has been approved or denied: APPROVED 07/16/17-07/17/2018  [facility] $1,256.68 Called pt left message to discuss Prolia Card after one use balance will be $243.32 [employer]  [phone]

## 2017-07-17 NOTE — Telephone Encounter (Addendum)
Kim please call this patient.    Ramond Craver, RMA  Jazmin Vensel, [name]        I had emailed patient about pap result and when she emailed back to thank me she added " What do I need to do in regards to getting the Prolia injections covered by my husbands insurance?"   I told her I would forward her question to the lady that handles the Prolia orders

## 2017-07-25 MED ORDER — DENOSUMAB 60 MG/ML ~~LOC~~ SOLN: 60.0000 mg | Freq: Once | SUBCUTANEOUS | 0 refills | Status: AC

## 2017-07-25 NOTE — Telephone Encounter (Signed)
Prescription sent electronically  Will call pt when we receive the med to schedule appt

## 2017-07-31 ENCOUNTER — Telehealth: Payer: Self-pay | Admitting: *Deleted

## 2017-07-31 NOTE — Telephone Encounter (Signed)
[employer] [facility] called regarding asking for allergies and dx code for Prolia. I gave listed allergies in epic and M81.0 for code.

## 2017-08-08 NOTE — Telephone Encounter (Addendum)
Spoke with patient she states she has been busy she will call us when she able to contact Prolia assist. She states she still wants to receive it just busy schedule. She's aware of prolia waiting at [place] pay update 1295.37 per [facility]

## 2017-08-20 ENCOUNTER — Encounter: Payer: Self-pay | Admitting: Obstetrics & Gynecology

## 2017-08-23 NOTE — Telephone Encounter (Signed)
Shannon Klein called stating they will ship Prolia to our office estimated arrival 08/28/17 will call pt and schedule appt once we receive medication

## 2017-09-04 NOTE — Telephone Encounter (Signed)
Prolia has arrived  Appt scheduled 09/17/17 at 10:30

## 2017-09-17 ENCOUNTER — Ambulatory Visit (INDEPENDENT_AMBULATORY_CARE_PROVIDER_SITE_OTHER): Payer: Managed Care, Other (non HMO) | Admitting: *Deleted

## 2017-09-17 DIAGNOSIS — M810 Age-related osteoporosis without current pathological fracture: Secondary | ICD-10-CM

## 2017-09-17 MED ORDER — DENOSUMAB 60 MG/ML ~~LOC~~ SOSY
60.0000 mg | PREFILLED_SYRINGE | Freq: Once | SUBCUTANEOUS | Status: AC
  Administered 2017-09-17: 60 mg via SUBCUTANEOUS

## 2017-09-19 ENCOUNTER — Encounter: Payer: Self-pay | Admitting: Obstetrics & Gynecology

## 2017-09-19 NOTE — Telephone Encounter (Signed)
Prolia given 09/17/17 Next inj 03/21/18

## 2017-12-26 ENCOUNTER — Other Ambulatory Visit: Payer: Self-pay | Admitting: Pediatrics

## 2017-12-26 DIAGNOSIS — Z1231 Encounter for screening mammogram for malignant neoplasm of breast: Secondary | ICD-10-CM

## 2018-01-07 ENCOUNTER — Ambulatory Visit
Admission: RE | Admit: 2018-01-07 | Discharge: 2018-01-07 | Disposition: A | Discharge status: Home / Self Care | Payer: Managed Care, Other (non HMO) | Source: Ambulatory Visit | Attending: Pediatrics | Admitting: Pediatrics

## 2018-01-07 DIAGNOSIS — Z1231 Encounter for screening mammogram for malignant neoplasm of breast: Secondary | ICD-10-CM | POA: Insufficient documentation

## 2018-01-17 ENCOUNTER — Telehealth: Payer: Self-pay | Admitting: *Deleted

## 2018-01-17 MED ORDER — DENOSUMAB 60 MG/ML ~~LOC~~ SOSY: 60.0000 mg | PREFILLED_SYRINGE | Freq: Once | SUBCUTANEOUS | 0 refills | Status: AC

## 2018-01-17 NOTE — Telephone Encounter (Addendum)
Deductible   $2000 ($0 Met)   OOP MAX $3000 ($met)  Annual exam 07/12/17 ML  Calcium   10.2          Date 07/12/17   Upcoming dental procedures   Prior Authorization needed Yes but still valid through 07/17/2018  Pt estimated Cost 1,295.37 pt states she has Health care spending account and will use that For now pt will wait week of 03/07/18 to call and pay [facility] specialty pharmacy  Pt would like a courtesy call to call Christella Scheuermann I will call her that week as a reminder  Appt 04/01/18 @2 :00  Amount left on Prolia Card 2019 is 243.32 Pt will have a remaninig balance of [phone]    Coverage Details: this will be filled at [place]  Reference # [id number]

## 2018-03-11 NOTE — Telephone Encounter (Signed)
Pt states she will call pharmacy and get med refilled

## 2018-04-01 ENCOUNTER — Ambulatory Visit (INDEPENDENT_AMBULATORY_CARE_PROVIDER_SITE_OTHER): Payer: Managed Care, Other (non HMO) | Admitting: Gynecology

## 2018-04-01 DIAGNOSIS — M810 Age-related osteoporosis without current pathological fracture: Secondary | ICD-10-CM | POA: Diagnosis not present

## 2018-04-01 MED ORDER — DENOSUMAB 60 MG/ML ~~LOC~~ SOSY
60.0000 mg | PREFILLED_SYRINGE | Freq: Once | SUBCUTANEOUS | Status: AC
  Administered 2018-04-01: 60 mg via SUBCUTANEOUS

## 2018-04-05 NOTE — Telephone Encounter (Signed)
PROLIA GIVEN 04/01/18 NEXT INJECTION 10/01/2018

## 2018-06-19 NOTE — Progress Notes (Signed)
Pt bought Prolia through [place]

## 2018-09-18 ENCOUNTER — Telehealth: Payer: Self-pay | Admitting: *Deleted

## 2018-09-18 DIAGNOSIS — M810 Age-related osteoporosis without current pathological fracture: Secondary | ICD-10-CM

## 2018-09-18 NOTE — Telephone Encounter (Addendum)
Deductible $2000 $72met)  OOP MAX $3000 (omet)  Annual exam 09/30/2018 ML  Calcium 10.1             Date 09/30/2018 Upcoming dental procedures   Prior Authorization needed in progress  Pt estimated Cost $139 plus ded of 2000 total is $2139 Pt bought Prolia through [place]   Pt will use Prolia card [id number] remaninig for pt to pay  Refill sent to [facility] waiting on Prolia to come in Mail to [place]   Coverage Details: 10% one dose, 10% admin fee

## 2018-09-26 ENCOUNTER — Other Ambulatory Visit: Payer: Self-pay

## 2018-09-30 ENCOUNTER — Ambulatory Visit (INDEPENDENT_AMBULATORY_CARE_PROVIDER_SITE_OTHER): Payer: Managed Care, Other (non HMO) | Admitting: Obstetrics & Gynecology

## 2018-09-30 ENCOUNTER — Other Ambulatory Visit: Payer: Self-pay

## 2018-09-30 ENCOUNTER — Encounter: Payer: Self-pay | Admitting: Obstetrics & Gynecology

## 2018-09-30 VITALS — BP 118/70 | Ht 61.5 in | Wt 129.0 lb

## 2018-09-30 DIAGNOSIS — Z780 Asymptomatic menopausal state: Secondary | ICD-10-CM

## 2018-09-30 DIAGNOSIS — Z01419 Encounter for gynecological examination (general) (routine) without abnormal findings: Secondary | ICD-10-CM | POA: Diagnosis not present

## 2018-09-30 DIAGNOSIS — Z90722 Acquired absence of ovaries, bilateral: Secondary | ICD-10-CM | POA: Diagnosis not present

## 2018-09-30 DIAGNOSIS — M810 Age-related osteoporosis without current pathological fracture: Secondary | ICD-10-CM | POA: Diagnosis not present

## 2018-09-30 NOTE — Progress Notes (Signed)
Shannon Klein Dec 26, 1960 [id number]   History:    58 y.o. G2P2L2 Married.  Sons are in their 25's and 20's.  RP:  Established patient presenting for annual gyn exam   HPI: S/P BSO prophylaxis at age 59 because of mother with ovarian cancer.  Menopause, stopped hormone replacement therapy 2 years ago.  Minimal vasomotor symptoms.  No postmenopausal bleeding.  No pelvic pain.  No pain with intercourse.  Normal vaginal secretions.  Osteoporosis on Prolia.  Urine and bowel movements normal.  Breast normal.  Body mass index 23.98.  Good fitness and nutrition.  Health labs with family physician.  Past medical history,surgical history, family history and social history were all reviewed and documented in the EPIC chart.  Gynecologic History Patient's last menstrual period was 02/07/2016 (approximate). Contraception: post menopausal status Last Pap: 06/2017. Results were: Negative, but no TZ cells. Last mammogram: 12/2017. Results were: Negative Bone Density: 06/2017 Osteoporosis at [facility] Colonoscopy: 2012  Obstetric History OB History  Gravida Para Term Preterm AB Living  2 2 2     2   SAB TAB Ectopic Multiple Live Births          2    # Outcome Date GA Lbr Len/2nd Weight Sex Delivery Anes PTL Lv  2 Term     M CS-Unspec  N LIV  1 Term     M CS-Unspec  N LIV     ROS: A ROS was performed and pertinent positives and negatives are included in the history.  GENERAL: No fevers or chills. HEENT: No change in vision, no earache, sore throat or sinus congestion. NECK: No pain or stiffness. CARDIOVASCULAR: No chest pain or pressure. No palpitations. PULMONARY: No shortness of breath, cough or wheeze. GASTROINTESTINAL: No abdominal pain, nausea, vomiting or diarrhea, melena or bright red blood per rectum. GENITOURINARY: No urinary frequency, urgency, hesitancy or dysuria. MUSCULOSKELETAL: No joint or muscle pain, no back pain, no recent trauma. DERMATOLOGIC: No rash, no itching, no lesions.  ENDOCRINE: No polyuria, polydipsia, no heat or cold intolerance. No recent change in weight. HEMATOLOGICAL: No anemia or easy bruising or bleeding. NEUROLOGIC: No headache, seizures, numbness, tingling or weakness. PSYCHIATRIC: No depression, no loss of interest in normal activity or change in sleep pattern.     Exam:   BP 118/70   Ht 5' 1.5" (1.562 m)   Wt 129 lb (58.5 kg)   LMP 02/07/2016 (Approximate) Comment: both ovaries removed  BMI 23.98 kg/m   Body mass index is 23.98 kg/m.  General appearance : Well developed well nourished female. No acute distress HEENT: Eyes: no retinal hemorrhage or exudates,  Neck supple, trachea midline, no carotid bruits, no thyroidmegaly Lungs: Clear to auscultation, no rhonchi or wheezes, or rib retractions  Heart: Regular rate and rhythm, no murmurs or gallops Breast:Examined in sitting and supine position were symmetrical in appearance, no palpable masses or tenderness,  no skin retraction, no nipple inversion, no nipple discharge, no skin discoloration, no axillary or supraclavicular lymphadenopathy Abdomen: no palpable masses or tenderness, no rebound or guarding Extremities: no edema or skin discoloration or tenderness  Pelvic: Vulva: Normal             Vagina: No gross lesions or discharge  Cervix: No gross lesions or discharge.  Pap reflex done.  Uterus  AV, normal size, shape and consistency, non-tender and mobile  Adnexa  Without masses or tenderness  Anus: Normal   Assessment/Plan:  58 y.o. female for annual exam  1. Encounter for routine gynecological examination with Papanicolaou smear of cervix Normal gynecologic exam status post BSO.  Pap reflex done today.  Breast exam normal.  Screening mammogram negative in August 2019.  Will schedule screening mammogram in August 2020.  Colonoscopy done in 2012.  Health labs with family physician.  Good body mass index at 23.98.  Continue with fitness and healthy nutrition.  2. S/P BSO  (bilateral salpingo-oophorectomy) Prophylactic BSO at age 82 because of mother with ovarian cancer.  Was on hormone replacement therapy until 2 years ago.  3. Postmenopause Well on no hormone replacement therapy for the last 2 years.  No postmenopausal bleeding.  4. Age-related osteoporosis without current pathological fracture We will continue on Prolia.  We will draw a calcium and vitamin D level today.  Last bone density was February 2019, will repeat a bone density February 2021.  Continue with Prolia.  Recommend regular weightbearing physical activities. - Calcium - VITAMIN D 25 Hydroxy (Vit-D Deficiency, Fractures)  [name] MD, 12:52 PM 09/30/2018

## 2018-10-01 LAB — CALCIUM: Calcium: 10.1 mg/dL (ref 8.6–10.4)

## 2018-10-01 LAB — PAP IG W/ RFLX HPV ASCU

## 2018-10-01 LAB — VITAMIN D 25 HYDROXY (VIT D DEFICIENCY, FRACTURES): Vit D, 25-Hydroxy: 34 ng/mL (ref 30–100)

## 2018-10-03 MED ORDER — DENOSUMAB 60 MG/ML ~~LOC~~ SOSY: 60.0000 mg | PREFILLED_SYRINGE | Freq: Once | SUBCUTANEOUS | 0 refills | Status: AC

## 2018-10-08 ENCOUNTER — Encounter: Payer: Self-pay | Admitting: Obstetrics & Gynecology

## 2018-10-08 NOTE — Patient Instructions (Signed)
1. Encounter for routine gynecological examination with Papanicolaou smear of cervix Normal gynecologic exam status post BSO.  Pap reflex done today.  Breast exam normal.  Screening mammogram negative in August 2019.  Will schedule screening mammogram in August 2020.  Colonoscopy done in 2012.  Health labs with family physician.  Good body mass index at 23.98.  Continue with fitness and healthy nutrition.  2. S/P BSO (bilateral salpingo-oophorectomy) Prophylactic BSO at age 58 because of mother with ovarian cancer.  Was on hormone replacement therapy until 2 years ago.  3. Postmenopause Well on no hormone replacement therapy for the last 2 years.  No postmenopausal bleeding.  4. Age-related osteoporosis without current pathological fracture We will continue on Prolia.  We will draw a calcium and vitamin D level today.  Last bone density was February 2019, will repeat a bone density February 2021.  Continue with Prolia.  Recommend regular weightbearing physical activities. - Calcium - VITAMIN D 25 Hydroxy (Vit-D Deficiency, Fractures)  Shannon Klein, it was a pleasure seeing you today!  I will inform you of your results as soon as they are available.

## 2018-10-09 MED ORDER — DENOSUMAB 60 MG/ML ~~LOC~~ SOSY: 60.0000 mg | PREFILLED_SYRINGE | Freq: Once | SUBCUTANEOUS | 0 refills | Status: AC

## 2018-10-22 NOTE — Telephone Encounter (Signed)
Per Christella Scheuermann they have called several times to get an consent to have [place] shipped. I called pt as well to let her know no answer will try again later.

## 2018-11-14 NOTE — Telephone Encounter (Signed)
Appt 11/18/2018 @ 11:00

## 2018-11-18 ENCOUNTER — Other Ambulatory Visit: Payer: Self-pay

## 2018-11-18 ENCOUNTER — Ambulatory Visit (INDEPENDENT_AMBULATORY_CARE_PROVIDER_SITE_OTHER): Payer: Managed Care, Other (non HMO) | Admitting: Anesthesiology

## 2018-11-18 DIAGNOSIS — M810 Age-related osteoporosis without current pathological fracture: Secondary | ICD-10-CM

## 2018-11-18 MED ORDER — DENOSUMAB 60 MG/ML ~~LOC~~ SOSY
60.0000 mg | PREFILLED_SYRINGE | Freq: Once | SUBCUTANEOUS | Status: AC
  Administered 2018-11-18: 60 mg via SUBCUTANEOUS

## 2018-12-12 NOTE — Telephone Encounter (Signed)
[place] 11/18/2018 NEXT INJECTION 05/21/2018

## 2018-12-16 ENCOUNTER — Encounter: Payer: Self-pay | Admitting: Anesthesiology

## 2019-02-11 ENCOUNTER — Encounter: Payer: Self-pay | Admitting: Gynecology

## 2019-03-03 ENCOUNTER — Other Ambulatory Visit: Payer: Self-pay | Admitting: Obstetrics & Gynecology

## 2019-03-03 DIAGNOSIS — Z1231 Encounter for screening mammogram for malignant neoplasm of breast: Secondary | ICD-10-CM

## 2019-04-16 ENCOUNTER — Telehealth: Payer: Self-pay | Admitting: *Deleted

## 2019-04-16 MED ORDER — DENOSUMAB 60 MG/ML ~~LOC~~ SOSY: 60.0000 mg | PREFILLED_SYRINGE | Freq: Once | SUBCUTANEOUS | 0 refills | Status: AC

## 2019-04-25 NOTE — Telephone Encounter (Addendum)
Annual exam 09/30/2018  Calcium 10.1            Date 09/30/2018  Upcoming dental procedures   Prior Authorization needed yes already on file 09/23/2018-09/23/2019  SENT PRESCRIPTION INTO [facility]   PROLIA APPT 05/26/2019 @ 11:20   NEXT INJECTION JAN 6TH CALL PT TO SET UP APPT

## 2019-05-19 ENCOUNTER — Ambulatory Visit
Admission: RE | Admit: 2019-05-19 | Discharge: 2019-05-19 | Disposition: A | Discharge status: Home / Self Care | Payer: Managed Care, Other (non HMO) | Source: Ambulatory Visit | Attending: Obstetrics & Gynecology | Admitting: Obstetrics & Gynecology

## 2019-05-19 ENCOUNTER — Other Ambulatory Visit: Payer: Self-pay

## 2019-05-19 DIAGNOSIS — Z1231 Encounter for screening mammogram for malignant neoplasm of breast: Secondary | ICD-10-CM | POA: Diagnosis present

## 2019-05-26 ENCOUNTER — Other Ambulatory Visit: Payer: Self-pay | Admitting: *Deleted

## 2019-05-26 ENCOUNTER — Ambulatory Visit (INDEPENDENT_AMBULATORY_CARE_PROVIDER_SITE_OTHER): Payer: Managed Care, Other (non HMO) | Admitting: Anesthesiology

## 2019-05-26 ENCOUNTER — Other Ambulatory Visit: Payer: Self-pay

## 2019-05-26 DIAGNOSIS — M810 Age-related osteoporosis without current pathological fracture: Secondary | ICD-10-CM

## 2019-05-26 DIAGNOSIS — M818 Other osteoporosis without current pathological fracture: Secondary | ICD-10-CM

## 2019-05-26 MED ORDER — DENOSUMAB 60 MG/ML ~~LOC~~ SOSY
60.0000 mg | PREFILLED_SYRINGE | Freq: Once | SUBCUTANEOUS | Status: AC
  Administered 2019-05-26: 60 mg via SUBCUTANEOUS

## 2019-06-23 ENCOUNTER — Other Ambulatory Visit: Payer: Self-pay

## 2019-07-08 ENCOUNTER — Other Ambulatory Visit: Payer: Self-pay

## 2019-07-08 ENCOUNTER — Encounter (INDEPENDENT_AMBULATORY_CARE_PROVIDER_SITE_OTHER): Payer: Managed Care, Other (non HMO)

## 2019-07-08 DIAGNOSIS — M810 Age-related osteoporosis without current pathological fracture: Secondary | ICD-10-CM | POA: Diagnosis not present

## 2019-07-08 DIAGNOSIS — Z780 Asymptomatic menopausal state: Secondary | ICD-10-CM | POA: Diagnosis not present

## 2019-07-08 DIAGNOSIS — Z13820 Encounter for screening for osteoporosis: Secondary | ICD-10-CM

## 2019-10-03 ENCOUNTER — Other Ambulatory Visit: Payer: Self-pay

## 2019-10-06 ENCOUNTER — Ambulatory Visit (INDEPENDENT_AMBULATORY_CARE_PROVIDER_SITE_OTHER): Payer: Managed Care, Other (non HMO) | Admitting: Obstetrics & Gynecology

## 2019-10-06 ENCOUNTER — Other Ambulatory Visit: Payer: Self-pay

## 2019-10-06 ENCOUNTER — Encounter: Payer: Self-pay | Admitting: Obstetrics & Gynecology

## 2019-10-06 VITALS — BP 120/76 | Ht 60.75 in | Wt 129.0 lb

## 2019-10-06 DIAGNOSIS — Z01419 Encounter for gynecological examination (general) (routine) without abnormal findings: Secondary | ICD-10-CM | POA: Diagnosis not present

## 2019-10-06 DIAGNOSIS — Z780 Asymptomatic menopausal state: Secondary | ICD-10-CM | POA: Diagnosis not present

## 2019-10-06 DIAGNOSIS — Z90722 Acquired absence of ovaries, bilateral: Secondary | ICD-10-CM | POA: Diagnosis not present

## 2019-10-06 DIAGNOSIS — M810 Age-related osteoporosis without current pathological fracture: Secondary | ICD-10-CM

## 2019-10-06 NOTE — Progress Notes (Signed)
Shannon Klein 1960/08/16 [id number]   History:    59 y.o. G2P2L2 Married.  Sons are in their 53's and 20's.  RP:  Established patient presenting for annual gyn exam   HPI: S/P BSO prophylaxis at age 73 because of mother with ovarian cancer.  Menopause, stopped hormone replacement therapy 3 years ago.  Minimal vasomotor symptoms.  No postmenopausal bleeding.  No pelvic pain.  No pain with intercourse, but needs lubricant.  Normal vaginal secretions.  Osteoporosis on Prolia x 3 doses.  Repeat BD done 06/2019.  Urine and bowel movements normal.  Breast normal.  Body mass index 24.58  Good fitness and nutrition. Fasting Health labs here today.  Past medical history,surgical history, family history and social history were all reviewed and documented in the EPIC chart.  Gynecologic History Patient's last menstrual period was 02/07/2016 (approximate).  Obstetric History OB History  Gravida Para Term Preterm AB Living  2 2 2     2   SAB TAB Ectopic Multiple Live Births          2    # Outcome Date GA Lbr Len/2nd Weight Sex Delivery Anes PTL Lv  2 Term     M CS-Unspec  N LIV  1 Term     M CS-Unspec  N LIV     ROS: A ROS was performed and pertinent positives and negatives are included in the history.  GENERAL: No fevers or chills. HEENT: No change in vision, no earache, sore throat or sinus congestion. NECK: No pain or stiffness. CARDIOVASCULAR: No chest pain or pressure. No palpitations. PULMONARY: No shortness of breath, cough or wheeze. GASTROINTESTINAL: No abdominal pain, nausea, vomiting or diarrhea, melena or bright red blood per rectum. GENITOURINARY: No urinary frequency, urgency, hesitancy or dysuria. MUSCULOSKELETAL: No joint or muscle pain, no back pain, no recent trauma. DERMATOLOGIC: No rash, no itching, no lesions. ENDOCRINE: No polyuria, polydipsia, no heat or cold intolerance. No recent change in weight. HEMATOLOGICAL: No anemia or easy bruising or bleeding. NEUROLOGIC: No  headache, seizures, numbness, tingling or weakness. PSYCHIATRIC: No depression, no loss of interest in normal activity or change in sleep pattern.     Exam:   BP 120/76   Ht 5' 0.75" (1.543 m)   Wt 129 lb (58.5 kg)   LMP 02/07/2016 (Approximate) Comment: both ovaries removed  BMI 24.58 kg/m   Body mass index is 24.58 kg/m.  General appearance : Well developed well nourished female. No acute distress HEENT: Eyes: no retinal hemorrhage or exudates,  Neck supple, trachea midline, no carotid bruits, no thyroidmegaly Lungs: Clear to auscultation, no rhonchi or wheezes, or rib retractions  Heart: Regular rate and rhythm, no murmurs or gallops Breast:Examined in sitting and supine position were symmetrical in appearance, no palpable masses or tenderness,  no skin retraction, no nipple inversion, no nipple discharge, no skin discoloration, no axillary or supraclavicular lymphadenopathy Abdomen: no palpable masses or tenderness, no rebound or guarding Extremities: no edema or skin discoloration or tenderness  Pelvic: Vulva: Normal             Vagina: No gross lesions or discharge  Cervix: No gross lesions or discharge  Uterus AV, normal size, shape and consistency, non-tender and mobile  Adnexa  Without masses or tenderness  Anus: Normal   Assessment/Plan:  59 y.o. female for annual exam   1. Well female exam with routine gynecological exam Normal gynecologic exam in menopause.  Pap test in May 2020 was negative, no  indication to repeat this year.  Breast exam normal.  Screening mammogram January 2021 was negative.  Colonoscopy in 2012.  Fasting health labs here today. - CBC - Comprehensive metabolic panel - Lipid panel - TSH - VITAMIN D 25 Hydroxy (Vit-D Deficiency, Fractures)  2. S/P BSO (bilateral salpingo-oophorectomy)  3. Postmenopause Well on no hormone replacement therapy.  4. Age-related osteoporosis without current pathological fracture Last bone density February 2021  showed osteopenia with the lowest T score at the lumbar spine at -2.1 which was an improvement at all sites including the bilateral hips and lumbar spine.  Patient has received 3 doses of Prolia.  We will continue on Prolia with vitamin D supplements, calcium intake of 1200 mg daily and regular weightbearing physical activities.  We will repeat a bone density in 2 years.  [name] MD, 10:18 AM 10/06/2019

## 2019-10-07 LAB — CBC
HCT: 44.1 % (ref 35.0–45.0)
Hemoglobin: 14.9 g/dL (ref 11.7–15.5)
MCH: 31 pg (ref 27.0–33.0)
MCHC: 33.8 g/dL (ref 32.0–36.0)
MCV: 91.7 fL (ref 80.0–100.0)
MPV: 9.5 fL (ref 7.5–12.5)
Platelets: 222 10*3/uL (ref 140–400)
RBC: 4.81 10*6/uL (ref 3.80–5.10)
RDW: 12.1 % (ref 11.0–15.0)
WBC: 5.5 10*3/uL (ref 3.8–10.8)

## 2019-10-07 LAB — COMPREHENSIVE METABOLIC PANEL
AG Ratio: 1.7 (calc) (ref 1.0–2.5)
ALT: 20 U/L (ref 6–29)
AST: 18 U/L (ref 10–35)
Albumin: 4.6 g/dL (ref 3.6–5.1)
Alkaline phosphatase (APISO): 32 U/L — ABNORMAL LOW (ref 37–153)
BUN: 12 mg/dL (ref 7–25)
CO2: 24 mmol/L (ref 20–32)
Calcium: 9.6 mg/dL (ref 8.6–10.4)
Chloride: 107 mmol/L (ref 98–110)
Creat: 0.75 mg/dL (ref 0.50–1.05)
Globulin: 2.7 g/dL (calc) (ref 1.9–3.7)
Glucose, Bld: 100 mg/dL — ABNORMAL HIGH (ref 65–99)
Potassium: 5 mmol/L (ref 3.5–5.3)
Sodium: 142 mmol/L (ref 135–146)
Total Bilirubin: 0.4 mg/dL (ref 0.2–1.2)
Total Protein: 7.3 g/dL (ref 6.1–8.1)

## 2019-10-07 LAB — LIPID PANEL
Cholesterol: 286 mg/dL — ABNORMAL HIGH (ref <200)
HDL: 53 mg/dL (ref >=50)
LDL Cholesterol (Calc): 183 mg/dL (calc) — ABNORMAL HIGH
Non-HDL Cholesterol (Calc): 233 mg/dL (calc) — ABNORMAL HIGH (ref <130)
Total CHOL/HDL Ratio: 5.4 (calc) — ABNORMAL HIGH (ref <5.0)
Triglycerides: 294 mg/dL — ABNORMAL HIGH (ref <150)

## 2019-10-07 LAB — VITAMIN D 25 HYDROXY (VIT D DEFICIENCY, FRACTURES): Vit D, 25-Hydroxy: 43 ng/mL (ref 30–100)

## 2019-10-07 LAB — TSH: TSH: 1.16 mIU/L (ref 0.40–4.50)

## 2019-10-08 ENCOUNTER — Encounter: Payer: Self-pay | Admitting: Obstetrics & Gynecology

## 2019-10-08 NOTE — Patient Instructions (Signed)
1. Well female exam with routine gynecological exam Normal gynecologic exam in menopause.  Pap test in May 2020 was negative, no indication to repeat this year.  Breast exam normal.  Screening mammogram January 2021 was negative.  Colonoscopy in 2012.  Fasting health labs here today. - CBC - Comprehensive metabolic panel - Lipid panel - TSH - VITAMIN D 25 Hydroxy (Vit-D Deficiency, Fractures)  2. S/P BSO (bilateral salpingo-oophorectomy)  3. Postmenopause Well on no hormone replacement therapy.  4. Age-related osteoporosis without current pathological fracture Last bone density February 2021 showed osteopenia with the lowest T score at the lumbar spine at -2.1 which was an improvement at all sites including the bilateral hips and lumbar spine.  Patient has received 3 doses of Prolia.  We will continue on Prolia with vitamin D supplements, calcium intake of 1200 mg daily and regular weightbearing physical activities.  We will repeat a bone density in 2 years.  Shannon Klein, it was a pleasure seeing you today!  I will inform you of your results as soon as they are available.

## 2019-10-21 NOTE — Telephone Encounter (Signed)
PROLIA GIVEN 05/26/2019 NEXT INJECTION 11/24/2019

## 2019-11-05 ENCOUNTER — Telehealth: Payer: Self-pay | Admitting: *Deleted

## 2019-11-05 NOTE — Telephone Encounter (Addendum)
Prolia insurance verification has been sent awaiting Summary of benefits  Calcium 9.6 10/06/2019  Pt gets Prolia through [facility] Medication has arrived. Awaiting pt call back  appt 01/05/2020

## 2019-11-10 NOTE — Telephone Encounter (Addendum)
Prescription sent to [facility]

## 2019-12-03 MED ORDER — DENOSUMAB 60 MG/ML ~~LOC~~ SOSY: 60.0000 mg | PREFILLED_SYRINGE | SUBCUTANEOUS | 0 refills | Status: DC

## 2020-01-05 ENCOUNTER — Ambulatory Visit (INDEPENDENT_AMBULATORY_CARE_PROVIDER_SITE_OTHER): Payer: Managed Care, Other (non HMO) | Admitting: Anesthesiology

## 2020-01-05 ENCOUNTER — Other Ambulatory Visit: Payer: Self-pay

## 2020-01-05 DIAGNOSIS — M810 Age-related osteoporosis without current pathological fracture: Secondary | ICD-10-CM | POA: Diagnosis not present

## 2020-01-05 MED ORDER — DENOSUMAB 60 MG/ML ~~LOC~~ SOSY
60.0000 mg | PREFILLED_SYRINGE | Freq: Once | SUBCUTANEOUS | Status: AC
  Administered 2020-01-05: 60 mg via SUBCUTANEOUS

## 2020-06-02 NOTE — Telephone Encounter (Signed)
PROLIA GIVEN 01/05/2020 NEXT INJECTION 07/09/2019

## 2020-06-09 ENCOUNTER — Telehealth: Payer: Self-pay | Admitting: *Deleted

## 2020-06-09 DIAGNOSIS — M810 Age-related osteoporosis without current pathological fracture: Secondary | ICD-10-CM

## 2020-06-09 MED ORDER — DENOSUMAB 60 MG/ML ~~LOC~~ SOSY: 60.0000 mg | PREFILLED_SYRINGE | Freq: Once | SUBCUTANEOUS | 0 refills | Status: AC

## 2020-06-09 NOTE — Telephone Encounter (Addendum)
Pt gets Prolia through [facility]  Order placed. Awaiting Med then will call pt to schedule.  Annual exam 10/06/2019  Calcium  9.6           Date 10/06/2019  Upcoming dental procedures     APPT 07/08/2020

## 2020-07-01 ENCOUNTER — Other Ambulatory Visit: Payer: Self-pay | Admitting: Obstetrics & Gynecology

## 2020-07-01 DIAGNOSIS — Z1231 Encounter for screening mammogram for malignant neoplasm of breast: Secondary | ICD-10-CM

## 2020-07-08 ENCOUNTER — Ambulatory Visit (INDEPENDENT_AMBULATORY_CARE_PROVIDER_SITE_OTHER): Payer: Managed Care, Other (non HMO) | Admitting: *Deleted

## 2020-07-08 ENCOUNTER — Other Ambulatory Visit: Payer: Self-pay

## 2020-07-08 DIAGNOSIS — M810 Age-related osteoporosis without current pathological fracture: Secondary | ICD-10-CM

## 2020-07-08 MED ORDER — DENOSUMAB 60 MG/ML ~~LOC~~ SOSY
60.0000 mg | PREFILLED_SYRINGE | Freq: Once | SUBCUTANEOUS | Status: AC
  Administered 2020-07-08: 60 mg via SUBCUTANEOUS

## 2020-07-19 ENCOUNTER — Ambulatory Visit
Admission: RE | Admit: 2020-07-19 | Discharge: 2020-07-19 | Disposition: A | Discharge status: Home / Self Care | Payer: Managed Care, Other (non HMO) | Source: Ambulatory Visit | Attending: Obstetrics & Gynecology | Admitting: Obstetrics & Gynecology

## 2020-07-19 ENCOUNTER — Other Ambulatory Visit: Payer: Self-pay

## 2020-07-19 DIAGNOSIS — Z1231 Encounter for screening mammogram for malignant neoplasm of breast: Secondary | ICD-10-CM | POA: Diagnosis present

## 2020-10-06 ENCOUNTER — Encounter: Payer: Managed Care, Other (non HMO) | Admitting: Obstetrics & Gynecology

## 2020-11-08 ENCOUNTER — Encounter: Payer: Self-pay | Admitting: Obstetrics & Gynecology

## 2020-11-08 ENCOUNTER — Other Ambulatory Visit (HOSPITAL_COMMUNITY)
Admission: RE | Admit: 2020-11-08 | Discharge: 2020-11-08 | Disposition: A | Discharge status: Home / Self Care | Payer: Managed Care, Other (non HMO) | Source: Ambulatory Visit | Attending: Obstetrics & Gynecology | Admitting: Obstetrics & Gynecology

## 2020-11-08 ENCOUNTER — Ambulatory Visit (INDEPENDENT_AMBULATORY_CARE_PROVIDER_SITE_OTHER): Payer: Managed Care, Other (non HMO) | Admitting: Obstetrics & Gynecology

## 2020-11-08 ENCOUNTER — Other Ambulatory Visit: Payer: Self-pay

## 2020-11-08 VITALS — BP 116/70 | HR 89 | Resp 16 | Ht 60.0 in | Wt 127.0 lb

## 2020-11-08 DIAGNOSIS — M810 Age-related osteoporosis without current pathological fracture: Secondary | ICD-10-CM | POA: Diagnosis not present

## 2020-11-08 DIAGNOSIS — Z780 Asymptomatic menopausal state: Secondary | ICD-10-CM

## 2020-11-08 DIAGNOSIS — Z01419 Encounter for gynecological examination (general) (routine) without abnormal findings: Secondary | ICD-10-CM | POA: Insufficient documentation

## 2020-11-08 DIAGNOSIS — Z90722 Acquired absence of ovaries, bilateral: Secondary | ICD-10-CM

## 2020-11-08 NOTE — Progress Notes (Signed)
Shannon Klein May 09, 1961 [id number]   History:    60 y.o. G2P2L2 Married.  Sons are in their 49's and 20's.   RP:  Established patient presenting for annual gyn exam   HPI: S/P BSO prophylaxis at age 14 because of mother with ovarian cancer.  Postmenopause, well on no HRT.  Minimal vasomotor symptoms.  No postmenopausal bleeding.  No pelvic pain.  No pain with intercourse, but needs lubricant.  Normal vaginal secretions.  Osteoporosis on Prolia.  Repeat BD done 06/2019 reviewed, in Osteopenia range.  Urine and bowel movements normal.  Breast normal.  Body mass index 24.8  Good fitness and nutrition. Fasting Health labs with Fam MD.  Will schedule Colonoscopy this year.  Past medical history,surgical history, family history and social history were all reviewed and documented in the EPIC chart.  Gynecologic History Patient's last menstrual period was 02/07/2016 (approximate).  Obstetric History OB History  Gravida Para Term Preterm AB Living  2 2 2     2   SAB IAB Ectopic Multiple Live Births          2    # Outcome Date GA Lbr Len/2nd Weight Sex Delivery Anes PTL Lv  2 Term     M CS-Unspec  N LIV  1 Term     M CS-Unspec  N LIV     ROS: A ROS was performed and pertinent positives and negatives are included in the history.  GENERAL: No fevers or chills. HEENT: No change in vision, no earache, sore throat or sinus congestion. NECK: No pain or stiffness. CARDIOVASCULAR: No chest pain or pressure. No palpitations. PULMONARY: No shortness of breath, cough or wheeze. GASTROINTESTINAL: No abdominal pain, nausea, vomiting or diarrhea, melena or bright red blood per rectum. GENITOURINARY: No urinary frequency, urgency, hesitancy or dysuria. MUSCULOSKELETAL: No joint or muscle pain, no back pain, no recent trauma. DERMATOLOGIC: No rash, no itching, no lesions. ENDOCRINE: No polyuria, polydipsia, no heat or cold intolerance. No recent change in weight. HEMATOLOGICAL: No anemia or easy bruising or  bleeding. NEUROLOGIC: No headache, seizures, numbness, tingling or weakness. PSYCHIATRIC: No depression, no loss of interest in normal activity or change in sleep pattern.     Exam:   BP 116/70   Pulse 89   Resp 16   Ht 5' (1.524 m)   Wt 127 lb (57.6 kg)   LMP 02/07/2016 (Approximate) Comment: both ovaries removed  BMI 24.80 kg/m   Body mass index is 24.8 kg/m.  General appearance : Well developed well nourished female. No acute distress HEENT: Eyes: no retinal hemorrhage or exudates,  Neck supple, trachea midline, no carotid bruits, no thyroidmegaly Lungs: Clear to auscultation, no rhonchi or wheezes, or rib retractions  Heart: Regular rate and rhythm, no murmurs or gallops Breast:Examined in sitting and supine position were symmetrical in appearance, no palpable masses or tenderness,  no skin retraction, no nipple inversion, no nipple discharge, no skin discoloration, no axillary or supraclavicular lymphadenopathy Abdomen: no palpable masses or tenderness, no rebound or guarding Extremities: no edema or skin discoloration or tenderness  Pelvic: Vulva: Normal             Vagina: No gross lesions or discharge  Cervix: No gross lesions or discharge.  Pap reflex done.  Uterus  RV, normal size, shape and consistency, non-tender and mobile  Adnexa  Without masses or tenderness  Anus: Normal   Assessment/Plan:  60 y.o. female for annual exam   1. Encounter for routine  gynecological examination with Papanicolaou smear of cervix Normal gynecologic exam in menopause.  Pap reflex done.  Breast exam normal.  Screening mammogram March 2022.  We will schedule colonoscopy this year.  Health labs with family physician. - Cytology - PAP( [facility])  2. S/P BSO (bilateral salpingo-oophorectomy)  3. Postmenopause Well on no hormone replacement therapy.  No postmenopausal bleeding.  4. Age-related osteoporosis without current pathological fracture Osteoporosis with a good response to  Prolia.  Last bone density in February 2021 was in the osteopenia range.  We will continue with Prolia, vitamin D, calcium 1.5 g/day total and regular weightbearing physical activities.  Other orders - rosuvastatin (CRESTOR) 5 MG tablet; Take 2.5 mg by mouth daily. - UNABLE TO FIND; Med Name: steroid eye drop   [name] MD, 1:52 PM 11/08/2020

## 2020-11-09 LAB — CYTOLOGY - PAP: Diagnosis: NEGATIVE

## 2020-12-22 ENCOUNTER — Telehealth: Payer: Self-pay | Admitting: *Deleted

## 2020-12-22 DIAGNOSIS — M810 Age-related osteoporosis without current pathological fracture: Secondary | ICD-10-CM

## 2020-12-22 NOTE — Telephone Encounter (Addendum)
Pt gets Prolia through [facility]   Annual exam 11/08/2020  Calcium   9.2          Date 12/27/2020  Upcoming dental procedures   Prior Authorization needed Yes processing case # [id number] should be processed 12/30/2020 awaiting call back  Prescription sent over call [facility] to make sure they got it on Friday  Appt 01/06/2021

## 2020-12-22 NOTE — Telephone Encounter (Signed)
PROLIA GIVEN 07/08/2020 NEXT INJECTION 01/06/2021

## 2020-12-27 ENCOUNTER — Other Ambulatory Visit: Payer: Managed Care, Other (non HMO)

## 2020-12-27 ENCOUNTER — Other Ambulatory Visit: Payer: Self-pay

## 2020-12-27 DIAGNOSIS — M810 Age-related osteoporosis without current pathological fracture: Secondary | ICD-10-CM

## 2020-12-27 LAB — CALCIUM: Calcium: 9.5 mg/dL (ref 8.6–10.4)

## 2020-12-29 MED ORDER — DENOSUMAB 60 MG/ML ~~LOC~~ SOSY: 60.0000 mg | PREFILLED_SYRINGE | SUBCUTANEOUS | 0 refills | Status: DC

## 2021-01-06 ENCOUNTER — Ambulatory Visit (INDEPENDENT_AMBULATORY_CARE_PROVIDER_SITE_OTHER): Payer: Managed Care, Other (non HMO) | Admitting: Gynecology

## 2021-01-06 ENCOUNTER — Other Ambulatory Visit: Payer: Self-pay

## 2021-01-06 DIAGNOSIS — M810 Age-related osteoporosis without current pathological fracture: Secondary | ICD-10-CM

## 2021-01-06 MED ORDER — DENOSUMAB 60 MG/ML ~~LOC~~ SOSY
60.0000 mg | PREFILLED_SYRINGE | Freq: Once | SUBCUTANEOUS | Status: AC
  Administered 2021-01-06: 60 mg via SUBCUTANEOUS

## 2021-01-24 ENCOUNTER — Encounter: Payer: Self-pay | Admitting: Obstetrics & Gynecology

## 2021-03-10 ENCOUNTER — Encounter: Payer: Self-pay | Admitting: Emergency Medicine

## 2021-03-10 ENCOUNTER — Other Ambulatory Visit: Payer: Self-pay

## 2021-03-10 ENCOUNTER — Ambulatory Visit
Admission: EM | Admit: 2021-03-10 | Discharge: 2021-03-10 | Disposition: A | Discharge status: Home / Self Care | Payer: Managed Care, Other (non HMO) | Attending: Emergency Medicine | Admitting: Emergency Medicine

## 2021-03-10 DIAGNOSIS — J069 Acute upper respiratory infection, unspecified: Secondary | ICD-10-CM | POA: Insufficient documentation

## 2021-03-10 LAB — RAPID INFLUENZA A&B ANTIGENS
Influenza A (ARMC): NEGATIVE
Influenza B (ARMC): NEGATIVE

## 2021-03-10 LAB — GROUP A STREP BY PCR: Group A Strep by PCR: NOT DETECTED

## 2021-03-10 MED ORDER — ONDANSETRON 8 MG PO TBDP: 8.0000 mg | ORAL_TABLET | Freq: Three times a day (TID) | ORAL | 0 refills | Status: DC | PRN

## 2021-03-10 MED ORDER — BENZONATATE 100 MG PO CAPS
200.0000 mg | ORAL_CAPSULE | Freq: Three times a day (TID) | ORAL | 0 refills | Status: DC
Start: 2021-03-10 — End: 2021-11-09

## 2021-03-10 MED ORDER — ONDANSETRON 4 MG PO TBDP
4.0000 mg | ORAL_TABLET | Freq: Once | ORAL | Status: AC
  Administered 2021-03-10: 4 mg via ORAL

## 2021-03-10 MED ORDER — IPRATROPIUM BROMIDE 0.06 % NA SOLN: 2.0000 | Freq: Four times a day (QID) | NASAL | 12 refills | Status: DC

## 2021-03-10 MED ORDER — PROMETHAZINE-DM 6.25-15 MG/5ML PO SYRP
5.0000 mL | ORAL_SOLUTION | Freq: Four times a day (QID) | ORAL | 0 refills | Status: DC | PRN
Start: 2021-03-10 — End: 2021-11-09

## 2021-03-10 MED ORDER — ACETAMINOPHEN 325 MG PO TABS
650.0000 mg | ORAL_TABLET | Freq: Once | ORAL | Status: AC
  Administered 2021-03-10: 650 mg via ORAL

## 2021-03-10 NOTE — Discharge Instructions (Addendum)
Use the Atrovent nasal spray, 2 squirts in each nostril every 6 hours, as needed for runny nose and postnasal drip.  Use the Tessalon Perles every 8 hours during the day.  Take them with a small sip of water.  They may give you some numbness to the base of your tongue or a metallic taste in your mouth, this is normal.  Use the Promethazine DM cough syrup at bedtime for cough and congestion.  It will make you drowsy so do not take it during the day.  Use the Zofran every 8 hours as needed for nausea and vomiting.  Use over-the-counter Tylenol and ibuprofen according to the package instructions as needed for body aches and fever.  Return for reevaluation or see your primary care provider for any new or worsening symptoms.

## 2021-03-10 NOTE — ED Provider Notes (Signed)
[facility]    CSN: [id number] Arrival date & time: 03/10/21  4098      History   Chief Complaint Chief Complaint  Patient presents with   Nasal Congestion   Facial Pain   Fever    HPI Shannon Klein is a 60 y.o. female.   HPI  60 year old female here for evaluation of respiratory complaints.  Patient reports that for the last 3 days she has been sentencing nasal congestion, sinus pain, and a sore throat.  She did develop a fever yesterday and it is currently 102.9 which is her T-max.  She reports that when she presses on her sinuses she has a rush of drainage that goes down the back of her throat and triggers her cough.  She is also complaining of pain in her ears and itching, nausea and vomiting, and a cough that is intermittently productive for green sputum.  This is in association with body aches and headache.  Patient denies runny nose, diarrhea, shortness breath or wheezing.  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   Hyperlipidemia     Patient Active Problem List   Diagnosis Date Noted   Other osteoporosis without current pathological fracture 03/22/2016   Surgical menopause 10/21/2012   Arthritis of hand 10/21/2012   Family history of ovarian cancer 06/19/2011    Past Surgical History:  Procedure Laterality Date   CESAREAN SECTION     X2   LASIK     BILATERAL   OOPHORECTOMY Bilateral 1997   PELVIC LAPAROSCOPY     BILATERAL OOPHORECTOMY   TONSILLECTOMY AND ADENOIDECTOMY      OB History     Gravida  2   Para  2   Term  2   Preterm      AB      Living  2      SAB      IAB      Ectopic      Multiple      Live Births  2            Home Medications    Prior to Admission medications   Medication Sig Start Date End Date Taking?  Authorizing Provider  benzonatate (TESSALON) 100 MG capsule Take 2 capsules (200 mg total) by mouth every 8 (eight) hours. 03/10/21  Yes Margarette Canada, NP  ipratropium (ATROVENT) 0.06 % nasal spray Place 2 sprays into both nostrils 4 (four) times daily. 03/10/21  Yes Margarette Canada, NP  ondansetron (ZOFRAN ODT) 8 MG disintegrating tablet Take 1 tablet (8 mg total) by mouth every 8 (eight) hours as needed for nausea or vomiting. 03/10/21  Yes Margarette Canada, NP  promethazine-dextromethorphan (PROMETHAZINE-DM) 6.25-15 MG/5ML syrup Take 5 mLs by mouth 4 (four) times daily as needed. 03/10/21  Yes Margarette Canada, NP  Biotin 1 MG CAPS Take by mouth.    [provider]  Calcium Carbonate-Vitamin D (CALCIUM + D PO) Take by mouth.    [provider]  denosumab (PROLIA) 60 MG/ML SOSY injection Inject 60 mg into the skin every 6 (six) months. 12/29/20   [name], MD  fluticasone (FLONASE) 50 MCG/ACT nasal spray Place into both nostrils as needed.    [provider]  loratadine (CLARITIN) 10 MG  tablet Take 10 mg by mouth as needed.    [provider]  meloxicam (MOBIC) 15 MG tablet Take 15 mg by mouth as needed.    [provider]  Multiple Vitamins-Minerals (HAIR/SKIN/NAILS PO) Take by mouth.    [provider]  omeprazole (PRILOSEC) 20 MG capsule Take 20 mg by mouth as needed.    [provider]  rosuvastatin (CRESTOR) 5 MG tablet Take 2.5 mg by mouth daily. 10/26/20   [provider]  UNABLE TO FIND Med Name: steroid eye drop    [provider]  vitamin E 600 UNIT capsule Take 600 Units by mouth daily.    [provider]    Family History Family History  Problem Relation Age of Onset   Cancer Mother 28       OVARIAN   Hypertension Father    Heart disease Father    Parkinson's disease Father    Diabetes Sister    Breast cancer Paternal Aunt    Breast cancer Cousin        pat cousin    Social  History Social History   Tobacco Use   Smoking status: Never   Smokeless tobacco: Never  Vaping Use   Vaping Use: Never used  Substance Use Topics   Alcohol use: Not Currently    Comment: 2 OR 3 GLASSES A WEEK OF WINE   Drug use: No     Allergies   Codeine, Fosamax [alendronate sodium], Erythromycin, and Penicillins   Review of Systems Review of Systems  Constitutional:  Positive for fatigue and fever. Negative for activity change and appetite change.  HENT:  Positive for congestion, ear pain, postnasal drip, sinus pressure, sinus pain and sore throat. Negative for rhinorrhea.   Respiratory:  Positive for cough. Negative for shortness of breath and wheezing.   Gastrointestinal:  Positive for nausea and vomiting. Negative for diarrhea.  Musculoskeletal:  Positive for arthralgias and myalgias.  Skin:  Negative for rash.  Neurological:  Positive for headaches.  Hematological: Negative.   Psychiatric/Behavioral: Negative.      Physical Exam Triage Vital Signs ED Triage Vitals  Enc Vitals Group     BP 03/10/21 0926 (!) 145/89     Pulse Rate 03/10/21 0926 (!) 119     Resp 03/10/21 0926 20     Temp 03/10/21 0926 (!) 102.9 F (39.4 C)     Temp Source 03/10/21 0926 Oral     SpO2 03/10/21 0926 97 %     Weight --      Height --      Head Circumference --      Peak Flow --      Pain Score 03/10/21 0923 7     Pain Loc --      Pain Edu? --      Excl. in [place]? --    No data found.  Updated Vital Signs BP (!) 145/89 (BP Location: Right Arm)   Pulse (!) 119   Temp (!) 102.9 F (39.4 C) (Oral)   Resp 20   LMP 02/07/2016 (Approximate) Comment: both ovaries removed  SpO2 97%   Visual Acuity Right Eye Distance:   Left Eye Distance:   Bilateral Distance:    Right Eye Near:   Left Eye Near:    Bilateral Near:     Physical Exam Vitals and nursing note reviewed.  Constitutional:      General: She is not in acute distress.    Appearance: Normal appearance.  She is  ill-appearing.  HENT:     Head: Normocephalic and atraumatic.     Right Ear: Tympanic membrane, ear canal and external ear normal. There is no impacted cerumen.     Left Ear: Tympanic membrane, ear canal and external ear normal. There is no impacted cerumen.     Nose: Congestion and rhinorrhea present.     Mouth/Throat:     Mouth: Mucous membranes are moist.     Pharynx: Oropharynx is clear. Posterior oropharyngeal erythema present.  Cardiovascular:     Rate and Rhythm: Normal rate and regular rhythm.     Pulses: Normal pulses.     Heart sounds: Normal heart sounds. No murmur heard.   No gallop.  Pulmonary:     Effort: Pulmonary effort is normal.     Breath sounds: Normal breath sounds. No wheezing, rhonchi or rales.  Musculoskeletal:     Cervical back: Normal range of motion and neck supple.  Lymphadenopathy:     Cervical: No cervical adenopathy.  Skin:    General: Skin is warm and dry.     Capillary Refill: Capillary refill takes less than 2 seconds.     Findings: No erythema or rash.  Neurological:     General: No focal deficit present.     Mental Status: She is alert and oriented to person, place, and time.  Psychiatric:        Mood and Affect: Mood normal.        Behavior: Behavior normal.        Thought Content: Thought content normal.        Judgment: Judgment normal.     UC Treatments / Results  Labs (all labs ordered are listed, but only abnormal results are displayed) Labs Reviewed  GROUP A STREP BY PCR  RAPID INFLUENZA A&B ANTIGENS    EKG   Radiology No results found.  Procedures Procedures (including critical care time)  Medications Ordered in UC Medications  acetaminophen (TYLENOL) tablet 650 mg (650 mg Oral Given 03/10/21 0951)  ondansetron (ZOFRAN-ODT) disintegrating tablet 4 mg (4 mg Oral Given 03/10/21 0951)    Initial Impression / Assessment and Plan / UC Course  I have reviewed the triage vital signs and the nursing notes.  Pertinent  labs & imaging results that were available during my care of the patient were reviewed by me and considered in my medical decision making (see chart for details).  Patient is a pleasant, though ill-appearing, 60 year old female here for evaluation of respiratory complaints as outlined in the HPI above.  Patient states that she took a COVID test at home which was negative and she is declining COVID testing here.  Her physical exam reveals pearly gray tympanic membranes bilaterally with normal light reflex and clear external auditory canals.  Nasal mucosa is erythematous edematous with copious clear nasal discharge in both nares.  Oropharyngeal exam reveals posterior oropharyngeal erythema with clear postnasal drip.  Tonsillar pillars are unremarkable.  No cervical lymphadenopathy appreciated on exam.  Cardiopulmonary exam reveals clear lung sounds in all fields.  When patient takes a deep breath does trigger a cough.  Patient is concerned that she might be developing bronchitis and I discussed that her symptoms are more consistent with influenza or COVID.  As mentioned above, patient is declining COVID testing since she had a negative at home COVID test.  Patient has consented to a flu test and she is requesting a strep test.  Patient also inquired about if she has  flu or strep positive if we could medicate her husband as he visits his 78 year old mother in rehab daily.  I advised her that he would need to be seen and we cannot just prescribe prophylaxis.  Rapid influenza is negative for influenza a and B.  Group A strep PCR is negative.  Patient is refusing COVID testing.  Will discharge patient home with a diagnosis of viral illness with a cough.  We will give Atrovent nasal spray to help with nasal congestion, Tessalon Perles help with cough, Promethazine DM cough syrup help with cough and congestion at bedtime, and Zofran to help with nausea.   Final Clinical Impressions(s) / UC Diagnoses   Final  diagnoses:  Viral URI with cough     Discharge Instructions      Use the Atrovent nasal spray, 2 squirts in each nostril every 6 hours, as needed for runny nose and postnasal drip.  Use the Tessalon Perles every 8 hours during the day.  Take them with a small sip of water.  They may give you some numbness to the base of your tongue or a metallic taste in your mouth, this is normal.  Use the Promethazine DM cough syrup at bedtime for cough and congestion.  It will make you drowsy so do not take it during the day.  Use the Zofran every 8 hours as needed for nausea and vomiting.  Use over-the-counter Tylenol and ibuprofen according to the package instructions as needed for body aches and fever.  Return for reevaluation or see your primary care provider for any new or worsening symptoms.      ED Prescriptions     Medication Sig Dispense Auth. Provider   benzonatate (TESSALON) 100 MG capsule Take 2 capsules (200 mg total) by mouth every 8 (eight) hours. 21 capsule Margarette Canada, NP   ipratropium (ATROVENT) 0.06 % nasal spray Place 2 sprays into both nostrils 4 (four) times daily. 15 mL Margarette Canada, NP   promethazine-dextromethorphan (PROMETHAZINE-DM) 6.25-15 MG/5ML syrup Take 5 mLs by mouth 4 (four) times daily as needed. 118 mL Margarette Canada, NP   ondansetron (ZOFRAN ODT) 8 MG disintegrating tablet Take 1 tablet (8 mg total) by mouth every 8 (eight) hours as needed for nausea or vomiting. 20 tablet Margarette Canada, NP      PDMP not reviewed this encounter.   Margarette Canada, NP 03/10/21 1028

## 2021-03-10 NOTE — ED Triage Notes (Addendum)
Pt presents today with c/o of nasal congestion, sore throat and low grade temp x 3 days. Home Covid test taken this a.m. negative. She took Tylenol at 4am. Declines Covid and Flu testing today. Requests Strep test.

## 2021-03-30 NOTE — Telephone Encounter (Signed)
PROLIA GIVEN 01/06/2021 NEXT INJECTION 07/10/2021

## 2021-07-07 ENCOUNTER — Telehealth: Payer: Self-pay | Admitting: *Deleted

## 2021-07-07 NOTE — Telephone Encounter (Signed)
Medication for Prolia pended for MD approval. Patient receives medication through, [facility], [place].  Routing to provider for review.

## 2021-07-13 NOTE — Telephone Encounter (Signed)
Canyon Day, Rene Kocher, RN; Karmen Bongo T, RN ?07/13/21-inbound call from pat who advised 1 week overdue for prolia inj. Advised pat will have someone f/u after benefits have been verified.  ? ?Call pt at # on file.  ? ? ? ?Dr. Dellis Filbert -Rx for Prolia pended. Please review and sign if ok to proceed.  ?

## 2021-07-14 MED ORDER — DENOSUMAB 60 MG/ML ~~LOC~~ SOSY: 60.0000 mg | PREFILLED_SYRINGE | SUBCUTANEOUS | 0 refills | Status: DC

## 2021-07-14 NOTE — Telephone Encounter (Signed)
Call to patient. Patient advised that Prolia sent to [facility]. Advised patient that pharmacy would reach out to her to authorize shipment of medication to our office and review benefits. Once prolia arrives at our office will reach out to patient to schedule nurse visit for injection. Patient agreeable.  ?

## 2021-07-22 NOTE — Telephone Encounter (Signed)
Prolia delivered to office.  ?Spoke with patient. Nurse visit scheduled for 07/26/21 at 11:15. Patient is agreeable to date/time.  ? ?Routing to [place], Therapist, sports  ?

## 2021-07-26 ENCOUNTER — Ambulatory Visit (INDEPENDENT_AMBULATORY_CARE_PROVIDER_SITE_OTHER): Payer: Managed Care, Other (non HMO)

## 2021-07-26 ENCOUNTER — Other Ambulatory Visit: Payer: Self-pay

## 2021-07-26 DIAGNOSIS — M810 Age-related osteoporosis without current pathological fracture: Secondary | ICD-10-CM | POA: Diagnosis not present

## 2021-07-26 MED ORDER — DENOSUMAB 60 MG/ML ~~LOC~~ SOSY
60.0000 mg | PREFILLED_SYRINGE | Freq: Once | SUBCUTANEOUS | Status: AC
  Administered 2021-07-26: 60 mg via SUBCUTANEOUS

## 2021-07-26 NOTE — Telephone Encounter (Signed)
Prolia given 07-26-21 ?Next given 01-27-22 ? ?Encounter closed ?

## 2021-07-26 NOTE — Progress Notes (Signed)
Patient in today for Prolia injection. ?Patient's initial calcium level was obtained on 12/27/20 .  Result: 9.5. ? ?Last AEX: 11/08/20  ?Last BMD: 07/08/19 ? ?Injection given in left arm.  Patient tolerated injection well.  ?Routed to provider for review. ? ?

## 2021-11-09 ENCOUNTER — Encounter: Payer: Self-pay | Admitting: Obstetrics & Gynecology

## 2021-11-09 ENCOUNTER — Ambulatory Visit (INDEPENDENT_AMBULATORY_CARE_PROVIDER_SITE_OTHER): Payer: Managed Care, Other (non HMO) | Admitting: Obstetrics & Gynecology

## 2021-11-09 VITALS — BP 118/66 | HR 82 | Ht 60.25 in | Wt 129.0 lb

## 2021-11-09 DIAGNOSIS — Z780 Asymptomatic menopausal state: Secondary | ICD-10-CM

## 2021-11-09 DIAGNOSIS — Z90722 Acquired absence of ovaries, bilateral: Secondary | ICD-10-CM

## 2021-11-09 DIAGNOSIS — Z01419 Encounter for gynecological examination (general) (routine) without abnormal findings: Secondary | ICD-10-CM

## 2021-11-09 DIAGNOSIS — M810 Age-related osteoporosis without current pathological fracture: Secondary | ICD-10-CM

## 2021-11-09 DIAGNOSIS — Z8041 Family history of malignant neoplasm of ovary: Secondary | ICD-10-CM | POA: Diagnosis not present

## 2021-11-09 NOTE — Progress Notes (Signed)
Shannon Klein 03-Jul-1960 [id number]   History:    61 y.o.  G2P2L2 Married.  Sons are in their 43's and 20's.   RP:  Established patient presenting for annual gyn exam   HPI: S/P BSO prophylaxis at age 54 because of mother with ovarian cancer.  Postmenopause, well on no HRT.  Minimal vasomotor symptoms.  No postmenopausal bleeding.  No pelvic pain.  No pain with intercourse, but needs lubricant.  Normal vaginal secretions.  No h/o abnormal Pap.  Pap Neg 10/2020.  Will repeat at 3 years.  Osteoporosis on Prolia.  Repeat BD done 06/2019 reviewed, in Osteopenia range.  Will repeat here now. Urine and bowel movements normal. Breast normal.  Mammo to schedule now. Body mass index 24.98  Good fitness and nutrition. Fasting Health labs with Fam MD.  Will schedule Colonoscopy this year.   Past medical history,surgical history, family history and social history were all reviewed and documented in the EPIC chart.  Gynecologic History Patient's last menstrual period was 02/07/2016 (approximate).  Obstetric History OB History  Gravida Para Term Preterm AB Living  '2 2 2     2  '$ SAB IAB Ectopic Multiple Live Births          2    # Outcome Date GA Lbr Len/2nd Weight Sex Delivery Anes PTL Lv  2 Term     M CS-Unspec  N LIV  1 Term     M CS-Unspec  N LIV     ROS: A ROS was performed and pertinent positives and negatives are included in the history. GENERAL: No fevers or chills. HEENT: No change in vision, no earache, sore throat or sinus congestion. NECK: No pain or stiffness. CARDIOVASCULAR: No chest pain or pressure. No palpitations. PULMONARY: No shortness of breath, cough or wheeze. GASTROINTESTINAL: No abdominal pain, nausea, vomiting or diarrhea, melena or bright red blood per rectum. GENITOURINARY: No urinary frequency, urgency, hesitancy or dysuria. MUSCULOSKELETAL: No joint or muscle pain, no back pain, no recent trauma. DERMATOLOGIC: No rash, no itching, no lesions. ENDOCRINE: No polyuria,  polydipsia, no heat or cold intolerance. No recent change in weight. HEMATOLOGICAL: No anemia or easy bruising or bleeding. NEUROLOGIC: No headache, seizures, numbness, tingling or weakness. PSYCHIATRIC: No depression, no loss of interest in normal activity or change in sleep pattern.     Exam:   BP 118/66   Pulse 82   Ht 5' 0.25" (1.53 m)   Wt 129 lb (58.5 kg)   LMP 02/07/2016 (Approximate) Comment: both ovaries removed  SpO2 97%   BMI 24.98 kg/m   Body mass index is 24.98 kg/m.  General appearance : Well developed well nourished female. No acute distress HEENT: Eyes: no retinal hemorrhage or exudates,  Neck supple, trachea midline, no carotid bruits, no thyroidmegaly Lungs: Clear to auscultation, no rhonchi or wheezes, or rib retractions  Heart: Regular rate and rhythm, no murmurs or gallops Breast:Examined in sitting and supine position were symmetrical in appearance, no palpable masses or tenderness,  no skin retraction, no nipple inversion, no nipple discharge, no skin discoloration, no axillary or supraclavicular lymphadenopathy Abdomen: no palpable masses or tenderness, no rebound or guarding Extremities: no edema or skin discoloration or tenderness  Pelvic: Vulva: Normal             Vagina: No gross lesions or discharge  Cervix: No gross lesions or discharge  Uterus  AV, normal size, shape and consistency, non-tender and mobile  Adnexa  Without masses or  tenderness  Anus: Normal   Assessment/Plan:  61 y.o. female for annual exam   1. Well female exam with routine gynecological exam S/P BSO prophylaxis at age 82 because of mother with ovarian cancer.  Postmenopause, well on no HRT.  Minimal vasomotor symptoms. No postmenopausal bleeding.  No pelvic pain.  No pain with intercourse, but needs lubricant.  Normal vaginal secretions.  No h/o abnormal Pap.  Pap Neg 10/2020.  Will repeat at 3 years.  Osteoporosis on Prolia.  Repeat BD done 06/2019 reviewed, in Osteopenia range.   Will repeat here now. Urine and bowel movements normal. Breast normal.  Mammo to schedule now. Body mass index 24.98  Good fitness and nutrition. Fasting Health labs with Fam MD.  Will schedule Colonoscopy this year.  2. Postmenopause S/P BSO prophylaxis at age 35 because of mother with ovarian cancer.  Postmenopause, well on no HRT.  Minimal vasomotor symptoms. No postmenopausal bleeding.  No pelvic pain.  No pain with intercourse, but needs lubricant.    3. Family history of ovarian cancer S/P BSO.  4. S/P BSO (bilateral salpingo-oophorectomy)  5. Age-related osteoporosis without current pathological fracture Osteoporosis on Prolia.  Repeat BD done 06/2019 reviewed, in Osteopenia range.  Will repeat here now. Vit D, Ca++ 1.5 g/d total, weight bearing physical activities on a regular basis. - DG Bone Density; Future  Other orders - meloxicam (MOBIC) 7.5 MG tablet; Take 7.5 mg by mouth as needed for pain. - UNABLE TO FIND; Med Name: cbd gummy - Biotin w/ Vitamins C & E (HAIR/SKIN/NAILS PO); Take by mouth.   [name] MD, 12:08 PM 11/09/2021

## 2021-11-25 ENCOUNTER — Other Ambulatory Visit: Payer: Self-pay

## 2021-11-25 ENCOUNTER — Telehealth: Payer: Self-pay

## 2021-11-25 DIAGNOSIS — Z1211 Encounter for screening for malignant neoplasm of colon: Secondary | ICD-10-CM

## 2021-11-25 MED ORDER — NA SULFATE-K SULFATE-MG SULF 17.5-3.13-1.6 GM/177ML PO SOLN: 1.0000 | Freq: Once | ORAL | 0 refills | Status: AC

## 2021-11-25 NOTE — Telephone Encounter (Signed)
Gastroenterology Pre-Procedure Review  Request Date: 02/13/22 Requesting Physician: Dr. Allen Norris  PATIENT REVIEW QUESTIONS: The patient responded to the following health history questions as indicated:    1. Are you having any GI issues? no 2. Do you have a personal history of Polyps? no 3. Do you have a family history of Colon Cancer or Polyps? no 4. Diabetes Mellitus? no 5. Joint replacements in the past 12 months?no 6. Major health problems in the past 3 months?no 7. Any artificial heart valves, MVP, or defibrillator?no    MEDICATIONS & ALLERGIES:    Patient reports the following regarding taking any anticoagulation/antiplatelet therapy:   Plavix, Coumadin, Eliquis, Xarelto, Lovenox, Pradaxa, Brilinta, or Effient? no Aspirin? no  Patient confirms/reports the following medications:  Current Outpatient Medications  Medication Sig Dispense Refill   Biotin 1 MG CAPS Take by mouth.     Biotin w/ Vitamins C & E (HAIR/SKIN/NAILS PO) Take by mouth.     Calcium Carbonate-Vitamin D (CALCIUM + D PO) Take by mouth.     denosumab (PROLIA) 60 MG/ML SOSY injection Inject 60 mg into the skin every 6 (six) months. 180 mL 0   fluticasone (FLONASE) 50 MCG/ACT nasal spray Place into both nostrils as needed.     loratadine (CLARITIN) 10 MG tablet Take 10 mg by mouth as needed.     meloxicam (MOBIC) 7.5 MG tablet Take 7.5 mg by mouth as needed for pain.     omeprazole (PRILOSEC) 20 MG capsule Take 20 mg by mouth as needed.     rosuvastatin (CRESTOR) 5 MG tablet Take 2.5 mg by mouth daily.     UNABLE TO FIND as needed. Med Name: steroid eye drop     UNABLE TO FIND Med Name: cbd gummy     vitamin E 600 UNIT capsule Take 600 Units by mouth daily.     No current facility-administered medications for this visit.    Patient confirms/reports the following allergies:  Allergies  Allergen Reactions   Codeine Itching   Fosamax [Alendronate Sodium]     GERD was extremely harsh and she stated it took her a  week to get better.   Erythromycin Nausea And Vomiting   Penicillins Hives    No orders of the defined types were placed in this encounter.   AUTHORIZATION INFORMATION Primary Insurance: 1D#: Group #:  Secondary Insurance: 1D#: Group #:  SCHEDULE INFORMATION: Date: 02/13/22 Time: Location: [place]

## 2021-12-06 ENCOUNTER — Other Ambulatory Visit: Payer: Self-pay | Admitting: Obstetrics & Gynecology

## 2021-12-06 ENCOUNTER — Ambulatory Visit (INDEPENDENT_AMBULATORY_CARE_PROVIDER_SITE_OTHER): Payer: Managed Care, Other (non HMO)

## 2021-12-06 DIAGNOSIS — Z780 Asymptomatic menopausal state: Secondary | ICD-10-CM

## 2021-12-06 DIAGNOSIS — Z13820 Encounter for screening for osteoporosis: Secondary | ICD-10-CM | POA: Diagnosis not present

## 2021-12-06 DIAGNOSIS — M8589 Other specified disorders of bone density and structure, multiple sites: Secondary | ICD-10-CM

## 2021-12-06 DIAGNOSIS — M810 Age-related osteoporosis without current pathological fracture: Secondary | ICD-10-CM

## 2021-12-13 ENCOUNTER — Other Ambulatory Visit: Payer: Self-pay | Admitting: Pediatrics

## 2021-12-13 DIAGNOSIS — Z1231 Encounter for screening mammogram for malignant neoplasm of breast: Secondary | ICD-10-CM

## 2022-01-02 ENCOUNTER — Ambulatory Visit
Admission: RE | Admit: 2022-01-02 | Discharge: 2022-01-02 | Disposition: A | Discharge status: Home / Self Care | Payer: Managed Care, Other (non HMO) | Source: Ambulatory Visit | Attending: Pediatrics | Admitting: Pediatrics

## 2022-01-02 DIAGNOSIS — Z1231 Encounter for screening mammogram for malignant neoplasm of breast: Secondary | ICD-10-CM | POA: Insufficient documentation

## 2022-01-19 ENCOUNTER — Telehealth: Payer: Self-pay | Admitting: *Deleted

## 2022-01-19 MED ORDER — DENOSUMAB 60 MG/ML ~~LOC~~ SOSY: 60.0000 mg | PREFILLED_SYRINGE | SUBCUTANEOUS | 0 refills | Status: DC

## 2022-01-19 NOTE — Telephone Encounter (Signed)
Prolia request sent to [facility]

## 2022-01-27 NOTE — Telephone Encounter (Signed)
Joaquim Lai from [facility] called and LVM stating that they will accept a verbal PA over the phone. Can call [phone] and ref # is [id number].

## 2022-02-01 ENCOUNTER — Encounter: Payer: Self-pay | Admitting: Gastroenterology

## 2022-02-03 NOTE — Telephone Encounter (Signed)
Prior auth approved case # [id number] Valid 02/03/2022-02/03/2023

## 2022-02-13 ENCOUNTER — Encounter
Admission: RE | Disposition: A | Discharge status: Home / Self Care | Payer: Self-pay | Source: Home / Self Care | Attending: Gastroenterology

## 2022-02-13 ENCOUNTER — Encounter: Payer: Self-pay | Admitting: Gastroenterology

## 2022-02-13 ENCOUNTER — Ambulatory Visit: Payer: Managed Care, Other (non HMO) | Admitting: Anesthesiology

## 2022-02-13 ENCOUNTER — Ambulatory Visit
Admission: RE | Admit: 2022-02-13 | Discharge: 2022-02-13 | Disposition: A | Discharge status: Home / Self Care | Payer: Managed Care, Other (non HMO) | Attending: Gastroenterology | Admitting: Gastroenterology

## 2022-02-13 ENCOUNTER — Other Ambulatory Visit: Payer: Self-pay

## 2022-02-13 DIAGNOSIS — K635 Polyp of colon: Secondary | ICD-10-CM | POA: Diagnosis not present

## 2022-02-13 DIAGNOSIS — K640 First degree hemorrhoids: Secondary | ICD-10-CM | POA: Diagnosis not present

## 2022-02-13 DIAGNOSIS — D123 Benign neoplasm of transverse colon: Secondary | ICD-10-CM | POA: Diagnosis not present

## 2022-02-13 DIAGNOSIS — D125 Benign neoplasm of sigmoid colon: Secondary | ICD-10-CM | POA: Insufficient documentation

## 2022-02-13 DIAGNOSIS — E785 Hyperlipidemia, unspecified: Secondary | ICD-10-CM | POA: Insufficient documentation

## 2022-02-13 DIAGNOSIS — M1990 Unspecified osteoarthritis, unspecified site: Secondary | ICD-10-CM | POA: Insufficient documentation

## 2022-02-13 DIAGNOSIS — Z1211 Encounter for screening for malignant neoplasm of colon: Secondary | ICD-10-CM | POA: Diagnosis present

## 2022-02-13 HISTORY — PX: COLONOSCOPY WITH PROPOFOL: SHX5780

## 2022-02-13 HISTORY — PX: POLYPECTOMY: SHX5525

## 2022-02-13 SURGERY — COLONOSCOPY WITH PROPOFOL
Anesthesia: General | Site: Rectum

## 2022-02-13 MED ORDER — STERILE WATER FOR IRRIGATION IR SOLN
Status: DC | PRN
  Administered 2022-02-13: 200 mL

## 2022-02-13 MED ORDER — PROPOFOL 10 MG/ML IV BOLUS
INTRAVENOUS | Status: DC | PRN
  Administered 2022-02-13 (×2): 70 mg via INTRAVENOUS
  Administered 2022-02-13: 20 mg via INTRAVENOUS
  Administered 2022-02-13: 40 mg via INTRAVENOUS

## 2022-02-13 MED ORDER — LIDOCAINE HCL (CARDIAC) PF 100 MG/5ML IV SOSY
PREFILLED_SYRINGE | INTRAVENOUS | Status: DC | PRN
  Administered 2022-02-13: 60 mg via INTRAVENOUS

## 2022-02-13 MED ORDER — LACTATED RINGERS IV SOLN: INTRAVENOUS | Status: DC

## 2022-02-13 MED ORDER — SODIUM CHLORIDE 0.9 % IV SOLN: INTRAVENOUS | Status: DC

## 2022-02-13 SURGICAL SUPPLY — 8 items
GOWN CVR UNV OPN BCK APRN NK (MISCELLANEOUS) ×4 IMPLANT
GOWN ISOL THUMB LOOP REG UNIV (MISCELLANEOUS) ×4
KIT PRC NS LF DISP ENDO (KITS) ×2 IMPLANT
KIT PROCEDURE OLYMPUS (KITS) ×2
MANIFOLD NEPTUNE II (INSTRUMENTS) ×2 IMPLANT
SNARE COLD EXACTO (MISCELLANEOUS) IMPLANT
TRAP ETRAP POLY (MISCELLANEOUS) IMPLANT
WATER STERILE IRR 250ML POUR (IV SOLUTION) ×2 IMPLANT

## 2022-02-13 NOTE — Anesthesia Preprocedure Evaluation (Signed)
Anesthesia Evaluation  Patient identified by MRN, date of birth, ID band Patient awake    Reviewed: Allergy & Precautions, NPO status , Patient's Chart, lab work & pertinent test results  History of Anesthesia Complications Negative for: history of anesthetic complications  Airway Mallampati: III  TM Distance: <3 FB Neck ROM: full    Dental  (+) Chipped   Pulmonary neg pulmonary ROS, neg shortness of breath,    Pulmonary exam normal        Cardiovascular Exercise Tolerance: Good (-) anginanegative cardio ROS Normal cardiovascular exam     Neuro/Psych negative neurological ROS  negative psych ROS   GI/Hepatic negative GI ROS, Neg liver ROS, neg GERD  ,  Endo/Other  negative endocrine ROS  Renal/GU negative Renal ROS  negative genitourinary   Musculoskeletal   Abdominal   Peds  Hematology negative hematology ROS (+)   Anesthesia Other Findings Past Medical History: No date: Arthritis     Comment:  ON FINGERS No date: Hyperlipidemia No date: Osteopenia No date: Osteoporosis  Past Surgical History: No date: CESAREAN SECTION     Comment:  X2 No date: LASIK     Comment:  BILATERAL 05/16/1995: OOPHORECTOMY; Bilateral No date: TONSILLECTOMY AND ADENOIDECTOMY  BMI    Body Mass Index: 25.18 kg/m      Reproductive/Obstetrics negative OB ROS                             Anesthesia Physical Anesthesia Plan  ASA: 3  Anesthesia Plan: General   Post-op Pain Management:    Induction: Intravenous  PONV Risk Score and Plan: Propofol infusion and TIVA  Airway Management Planned: Natural Airway and Nasal Cannula  Additional Equipment:   Intra-op Plan:   Post-operative Plan:   Informed Consent: I have reviewed the patients History and Physical, chart, labs and discussed the procedure including the risks, benefits and alternatives for the proposed anesthesia with the patient or  authorized representative who has indicated his/her understanding and acceptance.     Dental Advisory Given  Plan Discussed with: Anesthesiologist, CRNA and Surgeon  Anesthesia Plan Comments: (Patient consented for risks of anesthesia including but not limited to:  - adverse reactions to medications - risk of airway placement if required - damage to eyes, teeth, lips or other oral mucosa - nerve damage due to positioning  - sore throat or hoarseness - Damage to heart, brain, nerves, lungs, other parts of body or loss of life  Patient voiced understanding.)        Anesthesia Quick Evaluation

## 2022-02-13 NOTE — Anesthesia Postprocedure Evaluation (Signed)
Anesthesia Post Note  Patient: SHEKINA CORDELL  Procedure(s) Performed: COLONOSCOPY WITH PROPOFOL (Rectum) POLYPECTOMY (Rectum)  Patient location during evaluation: Endoscopy Anesthesia Type: General Level of consciousness: awake and alert Pain management: pain level controlled Vital Signs Assessment: post-procedure vital signs reviewed and stable Respiratory status: spontaneous breathing, nonlabored ventilation, respiratory function stable and patient connected to nasal cannula oxygen Cardiovascular status: blood pressure returned to baseline and stable Postop Assessment: no apparent nausea or vomiting Anesthetic complications: no   No notable events documented.   Last Vitals:  Vitals:   02/13/22 0854 02/13/22 0900  BP: (!) 147/84 105/75  Pulse: 72 76  Resp: 16 17  Temp: (!) 36.3 C (!) 36.3 C  SpO2: 95% 97%    Last Pain:  Vitals:   02/13/22 0900  TempSrc:   PainSc: 0-No pain                 Precious Haws Wiley Flicker

## 2022-02-13 NOTE — H&P (Signed)
Lucilla Lame, MD [address] [place], [place] [address] Phone: [phone] Fax : [phone]  Primary Care Physician:  Barbaraann Boys, MD Primary Gastroenterologist:  Dr. Allen Norris  Pre-Procedure History & Physical: HPI:  Shannon Klein is a 61 y.o. female is here for a screening colonoscopy.   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   Hyperlipidemia    Osteopenia    Osteoporosis     Past Surgical History:  Procedure Laterality Date   CESAREAN SECTION     X2   LASIK     BILATERAL   OOPHORECTOMY Bilateral 05/16/1995   TONSILLECTOMY AND ADENOIDECTOMY      Prior to Admission medications   Medication Sig Start Date End Date Taking? Authorizing Provider  Biotin 1 MG CAPS Take by mouth.   Yes [provider]  Biotin w/ Vitamins C & E (HAIR/SKIN/NAILS PO) Take by mouth.   Yes [provider]  Calcium Carbonate-Vitamin D (CALCIUM + D PO) Take by mouth.   Yes [provider]  denosumab (PROLIA) 60 MG/ML SOSY injection Inject 60 mg into the skin every 6 (six) months. 01/19/22  Yes [name], MD  fluticasone (FLONASE) 50 MCG/ACT nasal spray Place into both nostrils as needed.   Yes [provider]  loratadine (CLARITIN) 10 MG tablet Take 10 mg by mouth as needed.   Yes [provider]  meloxicam (MOBIC) 7.5 MG tablet Take 7.5 mg by mouth as needed for pain.   Yes [provider]  omeprazole (PRILOSEC) 20 MG capsule Take 20 mg by mouth as needed.   Yes [provider]  rosuvastatin (CRESTOR) 5 MG tablet Take 2.5 mg by mouth daily. 10/26/20  Yes [provider]  [place] as needed. Med Name: steroid eye drop   Yes [provider]  UNABLE TO FIND Med Name: cbd gummy   Yes [provider]  vitamin E 600 UNIT capsule Take 600 Units by mouth daily.   Yes [provider]    Allergies as of 11/25/2021 - Review Complete 11/09/2021  Allergen  Reaction Noted   Codeine Itching 04/17/2013   Fosamax [alendronate sodium]  08/09/2016   Erythromycin Nausea And Vomiting 02/17/2016   Penicillins Hives 06/19/2011    Family History  Problem Relation Age of Onset   Cancer Mother 7       OVARIAN   Hypertension Father    Heart disease Father    Parkinson's disease Father    Diabetes Sister    Breast cancer Paternal Aunt    Breast cancer Cousin        pat cousin    Social History   Socioeconomic History   Marital status: Married    Spouse name: Not on file   Number of children: Not on file   Years of education: Not on file   Highest education level: Not on file  Occupational History   Not on file  Tobacco Use   Smoking status: Never   Smokeless tobacco: Never  Vaping Use   Vaping Use: Never used  Substance and Sexual Activity   Alcohol use: Yes    Alcohol/week: 7.0 standard drinks of alcohol    Types: 7 Glasses of wine per week    Comment: 1 glass of wine a night   Drug use: No   Sexual activity: Yes    Partners: Male    Birth control/protection: Surgical    Comment:  1st intercourse- 19, partners- 3, ovaries removed  Other Topics Concern   Not on file  Social History Narrative   Not on file   Social Determinants of Health   Financial Resource Strain: Not on file  Food Insecurity: Not on file  Transportation Needs: Not on file  Physical Activity: Not on file  Stress: Not on file  Social Connections: Not on file  Intimate Partner Violence: Not on file    Review of Systems: See HPI, otherwise negative ROS  Physical Exam: BP 128/75   Pulse 82   Temp 97.9 F (36.6 C) (Temporal)   Resp 16   Ht 5' 0.25" (1.53 m)   Wt 59 kg   LMP 02/07/2016 (Approximate) Comment: both ovaries removed  SpO2 100%   BMI 25.18 kg/m  General:   Alert,  pleasant and cooperative in NAD Head:  Normocephalic and atraumatic. Neck:  Supple; no masses or thyromegaly. Lungs:  Clear throughout to auscultation.    Heart:   Regular rate and rhythm. Abdomen:  Soft, nontender and nondistended. Normal bowel sounds, without guarding, and without rebound.   Neurologic:  Alert and  oriented x4;  grossly normal neurologically.  Impression/Plan: Shannon Klein is now here to undergo a screening colonoscopy.  Risks, benefits, and alternatives regarding colonoscopy have been reviewed with the Klein.  Questions have been answered.  All parties agreeable.

## 2022-02-13 NOTE — Op Note (Signed)
[facility] Patient Name: Shannon Klein Procedure Date: 02/13/2022 8:33 AM MRN: [id number] Account #: [id number] Date of Birth: 02/06/1961 Admit Type: Outpatient Age: 61 Room: [facility] OR ROOM 01 Gender: Female Note Status: Finalized Instrument Name: [id number] Procedure:             Colonoscopy Indications:           Screening for colorectal malignant neoplasm Providers:             Lucilla Lame MD, MD Referring MD:          Ane Payment, MD (Referring MD) Medicines:             Propofol per Anesthesia Complications:         No immediate complications. Procedure:             Pre-Anesthesia Assessment:                        - Prior to the procedure, a History and Physical was                         performed, and patient medications and allergies were                         reviewed. The patient's tolerance of previous                         anesthesia was also reviewed. The risks and benefits                         of the procedure and the sedation options and risks                         were discussed with the patient. All questions were                         answered, and informed consent was obtained. Prior                         Anticoagulants: The patient has taken no previous                         anticoagulant or antiplatelet agents. ASA Grade                         Assessment: II - A patient with mild systemic disease.                         After reviewing the risks and benefits, the patient                         was deemed in satisfactory condition to undergo the                         procedure.                        After obtaining informed consent, the colonoscope was  passed under direct vision. Throughout the procedure,                         the patient's blood pressure, pulse, and oxygen                         saturations were monitored continuously. The                         Colonoscope  was introduced through the anus and                         advanced to the the cecum, identified by appendiceal                         orifice and ileocecal valve. The colonoscopy was                         performed without difficulty. The patient tolerated                         the procedure well. The quality of the bowel                         preparation was good. Findings:      The perianal and digital rectal examinations were normal.      A 10 mm polyp was found in the hepatic flexure. The polyp was sessile.       The polyp was removed with a cold snare. Resection and retrieval were       complete.      A 3 mm polyp was found in the sigmoid colon. The polyp was sessile. The       polyp was removed with a cold snare. Resection and retrieval were       complete.      Non-bleeding internal hemorrhoids were found during retroflexion. The       hemorrhoids were Grade I (internal hemorrhoids that do not prolapse). Impression:            - One 10 mm polyp at the hepatic flexure, removed with                         a cold snare. Resected and retrieved.                        - One 3 mm polyp in the sigmoid colon, removed with a                         cold snare. Resected and retrieved.                        - Non-bleeding internal hemorrhoids. Recommendation:        - Discharge patient to home.                        - Resume previous diet.                        - Continue present medications.                        -  Await pathology results.                        - If the pathology report reveals adenomatous tissue,                         then repeat the colonoscopy for surveillance in 5                         years. Procedure Code(s):     --- Professional ---                        [phone], Colonoscopy, flexible; with removal of                         tumor(s), polyp(s), or other lesion(s) by snare                         technique Diagnosis Code(s):     --- Professional  ---                        Z12.11, Encounter for screening for malignant neoplasm                         of colon                        K63.5, Polyp of colon CPT copyright 2019 American Medical Association. All rights reserved. The codes documented in this report are preliminary and upon coder review may  be revised to meet current compliance requirements. Lucilla Lame MD, MD 02/13/2022 8:53:55 AM This report has been signed electronically. Number of Addenda: 0 Note Initiated On: 02/13/2022 8:33 AM Scope Withdrawal Time: 0 hours 7 minutes 33 seconds  Total Procedure Duration: 0 hours 12 minutes 15 seconds  Estimated Blood Loss:  Estimated blood loss: none.      [facility]

## 2022-02-13 NOTE — Transfer of Care (Signed)
Immediate Anesthesia Transfer of Care Note  Patient: Shannon Klein  Procedure(s) Performed: COLONOSCOPY WITH PROPOFOL (Rectum) POLYPECTOMY (Rectum)  Patient Location: PACU  Anesthesia Type: General  Level of Consciousness: awake, alert  and patient cooperative  Airway and Oxygen Therapy: Patient Spontanous Breathing and Patient connected to supplemental oxygen  Post-op Assessment: Post-op Vital signs reviewed, Patient's Cardiovascular Status Stable, Respiratory Function Stable, Patent Airway and No signs of Nausea or vomiting  Post-op Vital Signs: Reviewed and stable  Complications: No notable events documented.

## 2022-02-14 ENCOUNTER — Encounter: Payer: Self-pay | Admitting: Gastroenterology

## 2022-02-14 LAB — SURGICAL PATHOLOGY

## 2022-02-14 NOTE — Telephone Encounter (Signed)
Annual exam 11/09/2021  Calcium 8.7            Date 11/10/2021  Upcoming dental procedures   Prior Authorization Prior auth approved case # [id number] Valid 02/03/2022-02/03/2023  Appt 02/28/2022

## 2022-02-15 ENCOUNTER — Encounter: Payer: Self-pay | Admitting: Gastroenterology

## 2022-02-28 ENCOUNTER — Ambulatory Visit (INDEPENDENT_AMBULATORY_CARE_PROVIDER_SITE_OTHER): Payer: Managed Care, Other (non HMO)

## 2022-02-28 DIAGNOSIS — M810 Age-related osteoporosis without current pathological fracture: Secondary | ICD-10-CM

## 2022-02-28 MED ORDER — DENOSUMAB 60 MG/ML ~~LOC~~ SOSY
60.0000 mg | PREFILLED_SYRINGE | Freq: Once | SUBCUTANEOUS | Status: AC
  Administered 2022-02-28: 60 mg via SUBCUTANEOUS

## 2022-02-28 NOTE — Progress Notes (Signed)
Prolia injection given SQ right arm.  Patient tolerated injection well.  Patient supplied medication (speciality pharmacy).  CMP @ [facility] 11/10/21.

## 2022-07-21 ENCOUNTER — Other Ambulatory Visit: Payer: Self-pay | Admitting: Obstetrics & Gynecology

## 2022-08-24 ENCOUNTER — Telehealth: Payer: Self-pay | Admitting: *Deleted

## 2022-08-24 NOTE — Telephone Encounter (Signed)
Patient receives prolia through specialty pharmacy, [facility]. PA on file and valid through 02-03-23. Routing to provider to for review and approval of medication.

## 2022-08-31 MED ORDER — DENOSUMAB 60 MG/ML ~~LOC~~ SOSY: 60.0000 mg | PREFILLED_SYRINGE | Freq: Once | SUBCUTANEOUS | 0 refills | Status: AC

## 2022-08-31 NOTE — Telephone Encounter (Signed)
Prolia #1, 0RF approved per Dr. Seymour Bars and sent to confirmed pharmacy, [facility].   Call to patient. Patient updated that Prolia sent to [facility]. Advied PA on file and good through 02-03-23. Mail order pharmacy would contact patient to review benefits and authorize and schedule delivery of medication to MD office. RN advised once medication received in our office, someone would contact her to schedule nurse visit for injection of Prolia. Patient agreeable.

## 2022-09-11 NOTE — Telephone Encounter (Signed)
-----   Message from Windle Guard, CMA sent at 09/08/2022  9:13 AM EDT ----- Regarding: Prolia injection Prolia injection was received for this patient today.  I put it in the specialty pharmacy Prolia injection bin in the lab refrigerator. KC,CMA

## 2022-09-11 NOTE — Telephone Encounter (Signed)
Call to patient. Patient advised prolia injection received in office on Friday, 09-08-22. Patient scheduled for Prolia injection on Friday, 09-15-22 at 10:15. Patient agreeable to date and time of appointment.   Encounter closed.

## 2022-09-15 ENCOUNTER — Ambulatory Visit (INDEPENDENT_AMBULATORY_CARE_PROVIDER_SITE_OTHER): Payer: Managed Care, Other (non HMO)

## 2022-09-15 DIAGNOSIS — M810 Age-related osteoporosis without current pathological fracture: Secondary | ICD-10-CM

## 2022-09-15 MED ORDER — DENOSUMAB 60 MG/ML ~~LOC~~ SOSY
60.0000 mg | PREFILLED_SYRINGE | Freq: Once | SUBCUTANEOUS | Status: AC
  Administered 2022-09-15: 60 mg via SUBCUTANEOUS

## 2022-09-15 NOTE — Progress Notes (Signed)
Prolia injection given SQ right arm.  Patient tolerated injection well. AEX is scheduled 11/22/22 with Dr. Seymour Bars

## 2022-11-22 ENCOUNTER — Ambulatory Visit (INDEPENDENT_AMBULATORY_CARE_PROVIDER_SITE_OTHER): Payer: Managed Care, Other (non HMO) | Admitting: Obstetrics & Gynecology

## 2022-11-22 ENCOUNTER — Encounter: Payer: Self-pay | Admitting: Obstetrics & Gynecology

## 2022-11-22 VITALS — BP 134/78 | HR 76 | Ht 60.5 in | Wt 132.0 lb

## 2022-11-22 DIAGNOSIS — Z8041 Family history of malignant neoplasm of ovary: Secondary | ICD-10-CM | POA: Diagnosis not present

## 2022-11-22 DIAGNOSIS — Z780 Asymptomatic menopausal state: Secondary | ICD-10-CM | POA: Diagnosis not present

## 2022-11-22 DIAGNOSIS — M810 Age-related osteoporosis without current pathological fracture: Secondary | ICD-10-CM

## 2022-11-22 DIAGNOSIS — Z01419 Encounter for gynecological examination (general) (routine) without abnormal findings: Secondary | ICD-10-CM | POA: Diagnosis not present

## 2022-11-22 DIAGNOSIS — Z90722 Acquired absence of ovaries, bilateral: Secondary | ICD-10-CM

## 2022-11-22 NOTE — Progress Notes (Signed)
Shannon Klein 02-28-1961 [id number]   History:    62 y.o. G2P2L2 Married.  Sons are in their 72's and 20's.   RP:  Established patient presenting for annual gyn exam   HPI: S/P BSO prophylaxis at age 26 because of mother with ovarian cancer.  Postmenopause, well on no HRT.  Minimal vasomotor symptoms.  No postmenopausal bleeding.  No pelvic pain.  No pain with intercourse, but needs lubricant.  Normal vaginal secretions.  No h/o abnormal Pap.  Pap Neg 10/2020.  Will repeat at 3 years.  Osteoporosis on Prolia.  BD 11/2021 showing Osteopenia T-Score -1.9 at [facility] Fem Neck, significantly improved. Urine and bowel movements normal. Breasts normal.  Mammo Neg 12/2021.  Mammo to schedule now. Body mass index 25.36.  Good fitness and nutrition. Fasting  Health labs with Fam MD. Colonoscopy 02/2022.    Past medical history,surgical history, family history and social history were all reviewed and documented in the EPIC chart.  Gynecologic History Patient's last menstrual period was 02/07/2016 (approximate).  Obstetric History OB History  Gravida Para Term Preterm AB Living  2 2 2     2   SAB IAB Ectopic Multiple Live Births          2    # Outcome Date GA Lbr Len/2nd Weight Sex Delivery Anes PTL Lv  2 Term     M CS-Unspec  N LIV  1 Term     M CS-Unspec  N LIV     ROS: A ROS was performed and pertinent positives and negatives are included in the history. GENERAL: No fevers or chills. HEENT: No change in vision, no earache, sore throat or sinus congestion. NECK: No pain or stiffness. CARDIOVASCULAR: No chest pain or pressure. No palpitations. PULMONARY: No shortness of breath, cough or wheeze. GASTROINTESTINAL: No abdominal pain, nausea, vomiting or diarrhea, melena or bright red blood per rectum. GENITOURINARY: No urinary frequency, urgency, hesitancy or dysuria. MUSCULOSKELETAL: No joint or muscle pain, no back pain, no recent trauma. DERMATOLOGIC: No rash, no itching, no lesions.  ENDOCRINE: No polyuria, polydipsia, no heat or cold intolerance. No recent change in weight. HEMATOLOGICAL: No anemia or easy bruising or bleeding. NEUROLOGIC: No headache, seizures, numbness, tingling or weakness. PSYCHIATRIC: No depression, no loss of interest in normal activity or change in sleep pattern.     Exam:   BP 134/78 (BP Location: Right Arm, Patient Position: Sitting, Cuff Size: Normal)   Pulse 76   Ht 5' 0.5" (1.537 m)   Wt 132 lb (59.9 kg)   LMP 02/07/2016 (Approximate) Comment: both ovaries removed  SpO2 97%   BMI 25.36 kg/m   Body mass index is 25.36 kg/m.  General appearance : Well developed well nourished female. No acute distress HEENT: Eyes: no retinal hemorrhage or exudates,  Neck supple, trachea midline, no carotid bruits, no thyroidmegaly Lungs: Clear to auscultation, no rhonchi or wheezes, or rib retractions  Heart: Regular rate and rhythm, no murmurs or gallops Breast:Examined in sitting and supine position were symmetrical in appearance, no palpable masses or tenderness,  no skin retraction, no nipple inversion, no nipple discharge, no skin discoloration, no axillary or supraclavicular lymphadenopathy Abdomen: no palpable masses or tenderness, no rebound or guarding Extremities: no edema or skin discoloration or tenderness  Pelvic: Vulva: Normal             Vagina: No gross lesions or discharge  Cervix: No gross lesions or discharge  Uterus  AV, normal size,  shape and consistency, non-tender and mobile  Adnexa  Without masses or tenderness  Anus: Normal   Assessment/Plan:  62 y.o. female for annual exam   1. Well female exam with routine gynecological exam S/P BSO prophylaxis at age 30 because of mother with ovarian cancer.  Postmenopause, well on no HRT.  Minimal vasomotor symptoms.  No postmenopausal bleeding.  No pelvic pain.  No pain with intercourse, but needs lubricant.  Normal vaginal secretions.  No h/o abnormal Pap.  Pap Neg 10/2020.  Will  repeat at 3 years.  Osteoporosis on Prolia.  BD 11/2021 showing Osteopenia T-Score -1.9 at [facility] Lt Fem Neck, significantly improved. Urine and bowel movements normal. Breasts normal.  Mammo Neg 12/2021.  Mammo to schedule now. Body mass index 25.36.  Good fitness and nutrition. Fasting  Health labs with Fam MD. Colonoscopy 02/2022.  2. Postmenopause Postmenopause, well on no HRT.  Minimal vasomotor symptoms.  No postmenopausal bleeding.  No pelvic pain.  No pain with intercourse, but needs lubricant.   3. Family history of ovarian cancer S/P BSO prophylaxis at age 34 because of mother with ovarian cancer.   4. S/P BSO (bilateral salpingo-oophorectomy)  5. Age-related osteoporosis without current pathological fracture Osteoporosis on Prolia.  BD 11/2021 showing Osteopenia T-Score -1.9 at [facility] Fem Neck, significantly improved. Planning to switch her Bone Health management to her Ortho closer to where she lives.  Other orders - PROLIA 60 MG/ML SOSY injection; Inject into the skin.   Genia Del MD, 10:54 AM

## 2022-12-07 ENCOUNTER — Other Ambulatory Visit: Payer: Self-pay | Admitting: Obstetrics & Gynecology

## 2022-12-07 DIAGNOSIS — Z1231 Encounter for screening mammogram for malignant neoplasm of breast: Secondary | ICD-10-CM

## 2023-01-09 ENCOUNTER — Ambulatory Visit
Admission: RE | Admit: 2023-01-09 | Discharge: 2023-01-09 | Disposition: A | Discharge status: Home / Self Care | Payer: Managed Care, Other (non HMO) | Source: Ambulatory Visit | Attending: Obstetrics & Gynecology | Admitting: Obstetrics & Gynecology

## 2023-01-09 DIAGNOSIS — Z1231 Encounter for screening mammogram for malignant neoplasm of breast: Secondary | ICD-10-CM | POA: Diagnosis present

## 2023-02-16 ENCOUNTER — Other Ambulatory Visit: Payer: Self-pay | Admitting: Obstetrics & Gynecology

## 2023-02-16 NOTE — Telephone Encounter (Signed)
Med refill request: prolia Last AEX: 11/22/22 Next AEX: 11/27/23 Last MMG (if hormonal med) 01/09/23 Refill authorized: Please Advise

## 2023-03-06 ENCOUNTER — Telehealth: Payer: Self-pay | Admitting: *Deleted

## 2023-03-06 NOTE — Telephone Encounter (Signed)
PA for prolia filled out and taken to Dr. Edward Jolly to review and sign. Will fax once signed.

## 2023-03-06 NOTE — Telephone Encounter (Signed)
-----   Message from [facility] M sent at 03/06/2023  1:26 PM EDT ----- Regarding: prolia pa a Norman Herrlich called and left a vm on the triage line about this patient and her prolia. Archie Patten states that they need a PA in order to ship prolia. She asked for you to call their office at [phone] you can reference Azriella's case number of [id number].

## 2023-03-07 NOTE — Telephone Encounter (Signed)
I signed the order and returned it to your desk.

## 2023-03-13 NOTE — Telephone Encounter (Signed)
PA faxed to [employer] at [phone]. Will await response.

## 2023-03-23 NOTE — Telephone Encounter (Signed)
RX prescription line:  Patient left voice mail:  Checking to see if Prolia refill was authorized through [facility].  Message routed to Triage.

## 2023-03-29 NOTE — Telephone Encounter (Signed)
Call to patient. Advised patient that PA faxed to [facility] on 03-13-23. Advised patient to contact [facility] directly. Patient agreeable.

## 2023-04-18 NOTE — Telephone Encounter (Signed)
Call to [facility], spoke with Larita Fife who states PA form not received by [facility] on 03-13-23. Larita Fife provided fax number of [phone].   Call to [facility], spoke with Gel. G, representative who states that the PA process was cancelled back in October with no update to provider. New PA initiated over the phone and approved 04/18/23 to 04/18/24, authorization #: [id number].

## 2023-04-18 NOTE — Telephone Encounter (Signed)
Approved PA for prolia faxed to [facility] at [phone]. Patient updated.

## 2023-04-26 ENCOUNTER — Other Ambulatory Visit: Payer: Self-pay | Admitting: *Deleted

## 2023-04-26 DIAGNOSIS — M810 Age-related osteoporosis without current pathological fracture: Secondary | ICD-10-CM

## 2023-04-26 MED ORDER — DENOSUMAB 60 MG/ML ~~LOC~~ SOSY
60.0000 mg | PREFILLED_SYRINGE | Freq: Once | SUBCUTANEOUS | Status: AC
  Administered 2023-04-27: 60 mg via SUBCUTANEOUS

## 2023-04-26 NOTE — Telephone Encounter (Signed)
Call to patient. Patient advised prolia injection just received in office. Patient scheduled for 04-27-23 at 1045. Patient agreeable to date and time of appointment. Encounter closed.

## 2023-04-27 ENCOUNTER — Ambulatory Visit: Payer: Managed Care, Other (non HMO)

## 2023-04-27 DIAGNOSIS — M810 Age-related osteoporosis without current pathological fracture: Secondary | ICD-10-CM | POA: Diagnosis not present

## 2023-10-16 ENCOUNTER — Telehealth: Payer: Self-pay

## 2023-10-16 DIAGNOSIS — M810 Age-related osteoporosis without current pathological fracture: Secondary | ICD-10-CM

## 2023-10-16 NOTE — Telephone Encounter (Signed)
 Patient supplied prolia  arrived in the office today. Patient is aware Shannon Klein will review her information & will call her to get scheduled for her injection.

## 2023-10-18 MED ORDER — DENOSUMAB 60 MG/ML ~~LOC~~ SOSY
60.0000 mg | PREFILLED_SYRINGE | SUBCUTANEOUS | Status: AC
  Administered 2023-10-30: 60 mg via SUBCUTANEOUS

## 2023-10-18 NOTE — Telephone Encounter (Signed)
 See prolia referral

## 2023-10-18 NOTE — Telephone Encounter (Signed)
 Bone density order signed by Dr. Colvin Dec and faxed to [facility] at [phone]. Phone number of patient provided on order form so they can contact her directly to schedule.   Encounter closed.

## 2023-10-24 ENCOUNTER — Telehealth: Payer: Self-pay | Admitting: Obstetrics and Gynecology

## 2023-10-24 DIAGNOSIS — M8580 Other specified disorders of bone density and structure, unspecified site: Secondary | ICD-10-CM

## 2023-10-24 NOTE — Telephone Encounter (Signed)
 Needs 2 year follow-up bone scan Dr. Tia Flowers

## 2023-10-30 ENCOUNTER — Ambulatory Visit (INDEPENDENT_AMBULATORY_CARE_PROVIDER_SITE_OTHER)

## 2023-10-30 DIAGNOSIS — M810 Age-related osteoporosis without current pathological fracture: Secondary | ICD-10-CM

## 2023-11-26 NOTE — Progress Notes (Unsigned)
 63 y.o. G43P2002 Married Caucasian female here for annual exam.    Received Prolia  10/30/23.  Her bone density is improving on Prolia .  Did not tolerate Fosamax  due to multiple side effects.   Had joint aches and pains.    Some vaginal dryness.  Declines prescription for this.   Had her ovaries removed at age 18.  Her mother died of ovarian cancer in her 61s.   Patient currently declines genetic testing.   Took HRT following this for about 20 years.  Occasional hot flash.  Want to do bone density at [facility]. Will do her mammogram with [facility] in [place].    Married for 36 years. 2 sons. Has a 14 mo grandson.    [place] the beach.   PCP: Delfina Pao, MD - [facility]  Patient's last menstrual period was 02/07/2016 (approximate).           Sexually active: Yes.    The current method of family planning is post menopausal status.    Menopausal hormone therapy:  n/a Exercising: Yes.    Kettle bells Smoker:  no  OB History  Gravida Para Term Preterm AB Living  2 2 2   2   SAB IAB Ectopic Multiple Live Births      2    # Outcome Date GA Lbr Len/2nd Weight Sex Type Anes PTL Lv  2 Term     M CS-Unspec  N LIV  1 Term     M CS-Unspec  N LIV     HEALTH MAINTENANCE: Last 2 paps:  11/08/20 neg, 09/30/18 neg  History of abnormal Pap or positive HPV:  no Mammogram:   01/09/23 Breast density Cat B, BIRADS Cat 1 neg  Colonoscopy:  02/13/22 - polyp - due in 2028 Bone Density:  12/06/21  Result  low bone mass    Immunization History  Administered Date(s) Administered   PFIZER(Purple Top)SARS-COV-2 Vaccination 07/28/2019, 08/18/2019   Tdap 10/21/2012      reports that she has never smoked. She has never used smokeless tobacco. She reports current alcohol use of about 7.0 standard drinks of alcohol per week. She reports that she does not use drugs.  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   Hyperlipidemia    Osteopenia     Osteoporosis     Past Surgical History:  Procedure Laterality Date   CESAREAN SECTION     X2   COLONOSCOPY WITH PROPOFOL  N/A 02/13/2022   Procedure: COLONOSCOPY WITH PROPOFOL ;  Surgeon: Jinny Carmine, MD;  Location: [facility];  Service: Endoscopy;  Laterality: N/A;   LASIK     BILATERAL   OOPHORECTOMY Bilateral 05/16/1995   POLYPECTOMY  02/13/2022   Procedure: POLYPECTOMY;  Surgeon: Jinny Carmine, MD;  Location: [facility];  Service: Endoscopy;;   TONSILLECTOMY AND ADENOIDECTOMY      Current Outpatient Medications  Medication Sig Dispense Refill   Biotin 1 MG CAPS Take by mouth.     Biotin w/ Vitamins C & E (HAIR/SKIN/NAILS PO) Take by mouth.     Calcium Carbonate-Vitamin D  (CALCIUM + D PO) Take by mouth.     denosumab  (PROLIA ) 60 MG/ML SOSY injection INJECT 60 MG UNDER THE SKIN ONCE FOR 1 DOSE 1 mL 1   fluticasone (FLONASE) 50 MCG/ACT nasal spray Place into both nostrils as needed.     loratadine (CLARITIN) 10 MG tablet Take 10 mg by mouth as needed.  meloxicam (MOBIC) 7.5 MG tablet Take 7.5 mg by mouth as needed for pain.     omeprazole (PRILOSEC) 20 MG capsule Take 20 mg by mouth as needed.     rosuvastatin (CRESTOR) 5 MG tablet Take 2.5 mg by mouth daily.     UNABLE TO FIND as needed. Med Name: steroid eye drop     UNABLE TO FIND Med Name: cbd gummy     vitamin E 600 UNIT capsule Take 600 Units by mouth daily.     No current facility-administered medications for this visit.    ALLERGIES: Codeine, Fosamax  [alendronate  sodium], Erythromycin, and Penicillins  Family History  Problem Relation Age of Onset   Cancer Mother 50       OVARIAN   Hypertension Father    Heart disease Father    Parkinson's disease Father    Diabetes Sister    Breast cancer Paternal Aunt    Breast cancer Cousin        pat cousin    Review of Systems  All other systems reviewed and are negative.   PHYSICAL EXAM:  BP 124/82 (BP Location: Left Arm, Patient Position:  Sitting)   Pulse 81   Ht 5' 0.25 (1.53 m)   Wt 134 lb (60.8 kg)   LMP 02/07/2016 (Approximate) Comment: both ovaries removed  SpO2 96%   BMI 25.95 kg/m     General appearance: alert, cooperative and appears stated age Head: normocephalic, without obvious abnormality, atraumatic Neck: no adenopathy, supple, symmetrical, trachea midline and thyroid normal to inspection and palpation Lungs: clear to auscultation bilaterally Breasts: normal appearance, no masses or tenderness, No nipple retraction or dimpling, No nipple discharge or bleeding, No axillary adenopathy Heart: regular rate and rhythm Abdomen: soft, non-tender; no masses, no organomegaly Extremities: extremities normal, atraumatic, no cyanosis or edema Skin: skin color, texture, turgor normal. No rashes or lesions Lymph nodes: cervical, supraclavicular, and axillary nodes normal. Neurologic: grossly normal  Pelvic: External genitalia:  no lesions              No abnormal inguinal nodes palpated.              Urethra:  normal appearing urethra with no masses, tenderness or lesions              Bartholins and Skenes: normal                 Vagina: normal appearing vagina with normal color and discharge, no lesions              Cervix: no lesions              Pap taken: yes Bimanual Exam:  Uterus:  normal size, contour, position, consistency, mobility, non-tender              Adnexa: no mass, fullness, tenderness              Rectal exam: yes.  Confirms.              Anus:  normal sphincter tone, no lesions  Chaperone was present for exam:  Kari HERO, CMA  ASSESSMENT: Well woman visit with gynecologic exam. Status post BSO age 66.   FH ovarian cancer in mother.  Breast cancer on paternal side of family.  Osteoporosis.  On Prolia .  PHQ-2-9: 0  PLAN: Mammogram screening discussed.  Ordered at [place], [place].  Self breast awareness reviewed. Pap and HRV collected:  yes Guidelines for Calcium, Vitamin D , regular exercise  program including cardiovascular and weight bearing exercise.  Medication refills:  Next Prolia  mid December, 2025.  BMD ordered for [facility].  Labs with PCP.  Vaginal moisturizers, cooking oils, and vit E for the vagina reviewed.  We discussed genetic counseling and testing if desired.  Patient will consider this.  Follow up:  yearly and prn.

## 2023-11-27 ENCOUNTER — Encounter: Payer: Self-pay | Admitting: Obstetrics and Gynecology

## 2023-11-27 ENCOUNTER — Other Ambulatory Visit (HOSPITAL_COMMUNITY)
Admission: RE | Admit: 2023-11-27 | Discharge: 2023-11-27 | Disposition: A | Discharge status: Home / Self Care | Source: Ambulatory Visit | Attending: Obstetrics and Gynecology | Admitting: Obstetrics and Gynecology

## 2023-11-27 ENCOUNTER — Ambulatory Visit (INDEPENDENT_AMBULATORY_CARE_PROVIDER_SITE_OTHER): Payer: Managed Care, Other (non HMO) | Admitting: Obstetrics and Gynecology

## 2023-11-27 VITALS — BP 124/82 | HR 81 | Ht 60.25 in | Wt 134.0 lb

## 2023-11-27 DIAGNOSIS — Z1331 Encounter for screening for depression: Secondary | ICD-10-CM

## 2023-11-27 DIAGNOSIS — Z124 Encounter for screening for malignant neoplasm of cervix: Secondary | ICD-10-CM | POA: Diagnosis present

## 2023-11-27 DIAGNOSIS — Z01419 Encounter for gynecological examination (general) (routine) without abnormal findings: Secondary | ICD-10-CM | POA: Diagnosis not present

## 2023-11-27 DIAGNOSIS — Z1231 Encounter for screening mammogram for malignant neoplasm of breast: Secondary | ICD-10-CM

## 2023-11-27 DIAGNOSIS — M810 Age-related osteoporosis without current pathological fracture: Secondary | ICD-10-CM | POA: Diagnosis not present

## 2023-11-27 NOTE — Patient Instructions (Signed)

## 2023-11-29 ENCOUNTER — Ambulatory Visit: Payer: Self-pay | Admitting: Obstetrics and Gynecology

## 2023-11-29 LAB — CYTOLOGY - PAP
Comment: NEGATIVE
Diagnosis: NEGATIVE
High risk HPV: NEGATIVE

## 2023-12-25 ENCOUNTER — Other Ambulatory Visit

## 2024-01-24 ENCOUNTER — Ambulatory Visit
Admission: RE | Admit: 2024-01-24 | Discharge: 2024-01-24 | Disposition: A | Discharge status: Home / Self Care | Source: Ambulatory Visit | Attending: Obstetrics and Gynecology | Admitting: Obstetrics and Gynecology

## 2024-01-24 DIAGNOSIS — M810 Age-related osteoporosis without current pathological fracture: Secondary | ICD-10-CM | POA: Diagnosis present

## 2024-02-25 ENCOUNTER — Ambulatory Visit
Admission: RE | Admit: 2024-02-25 | Discharge: 2024-02-25 | Disposition: A | Discharge status: Home / Self Care | Source: Ambulatory Visit | Attending: Obstetrics and Gynecology | Admitting: Obstetrics and Gynecology

## 2024-02-25 DIAGNOSIS — Z1231 Encounter for screening mammogram for malignant neoplasm of breast: Secondary | ICD-10-CM | POA: Insufficient documentation

## 2024-03-25 ENCOUNTER — Other Ambulatory Visit: Payer: Self-pay | Admitting: Obstetrics and Gynecology

## 2024-03-25 NOTE — Telephone Encounter (Signed)
 Medication refill request: prolia  Last AEX:  11-27-23 Next AEX: 12-01-24 Last MMG (if hormonal medication request): n/a Refill authorized: prolia  last sent 02-19-23. Please approve if appopriate

## 2024-04-28 ENCOUNTER — Encounter: Payer: Self-pay | Admitting: Obstetrics and Gynecology

## 2024-04-30 ENCOUNTER — Other Ambulatory Visit (HOSPITAL_COMMUNITY): Payer: Self-pay

## 2024-04-30 ENCOUNTER — Telehealth: Payer: Self-pay

## 2024-04-30 ENCOUNTER — Other Ambulatory Visit: Payer: Self-pay | Admitting: *Deleted

## 2024-04-30 DIAGNOSIS — M810 Age-related osteoporosis without current pathological fracture: Secondary | ICD-10-CM

## 2024-04-30 MED ORDER — DENOSUMAB 60 MG/ML ~~LOC~~ SOSY
60.0000 mg | PREFILLED_SYRINGE | SUBCUTANEOUS | Status: AC
  Administered 2024-05-26: 60 mg via SUBCUTANEOUS

## 2024-04-30 NOTE — Telephone Encounter (Signed)
 Prolia VOB initiated via [email or web address]  Next Prolia inj DUE: NOW

## 2024-05-01 ENCOUNTER — Other Ambulatory Visit (HOSPITAL_COMMUNITY): Payer: Self-pay

## 2024-05-01 NOTE — Telephone Encounter (Signed)
 SABRA

## 2024-05-02 ENCOUNTER — Other Ambulatory Visit (HOSPITAL_COMMUNITY): Payer: Self-pay

## 2024-05-02 NOTE — Telephone Encounter (Signed)
 Called insurance at [phone] to start new pharmacy PA. Representative faxing PA form. Will complete and fax back once received.

## 2024-05-02 NOTE — Telephone Encounter (Signed)
 PA faxed to [phone].  Blank PA form attached to charts.

## 2024-05-12 ENCOUNTER — Other Ambulatory Visit (HOSPITAL_COMMUNITY): Payer: Self-pay

## 2024-05-12 NOTE — Telephone Encounter (Signed)
 Proceed with MTDM services  Med obtained from pharmacy and shipped to clinic:  Pharmacy benefit: Copay $1,796.09 (Paid to pharmacy) Admin Fee: 10% (Pay at clinic)  Prior Auth: APPROVED PA# [id number] Expiration Date: 05/02/24-05/09/25   # of doses approved: 2   Patient IS eligible for Copay Card. Copay Card can make patient's cost as little as $25. Link to apply: https://www.amgensupportplus.com/copay  ** This summary of benefits is an estimation of the patient's out-of-pocket cost. Exact cost may very based on individual plan coverage.

## 2024-05-12 NOTE — Telephone Encounter (Signed)
" ° ° °  PHARMACY COPAY: $1,796.09 "

## 2024-05-16 ENCOUNTER — Other Ambulatory Visit: Payer: Self-pay

## 2024-05-20 ENCOUNTER — Other Ambulatory Visit: Payer: Self-pay

## 2024-05-20 ENCOUNTER — Other Ambulatory Visit (HOSPITAL_COMMUNITY): Payer: Self-pay

## 2024-05-20 ENCOUNTER — Ambulatory Visit: Attending: Obstetrics and Gynecology

## 2024-05-20 DIAGNOSIS — Z79899 Other long term (current) drug therapy: Secondary | ICD-10-CM

## 2024-05-20 DIAGNOSIS — M810 Age-related osteoporosis without current pathological fracture: Secondary | ICD-10-CM

## 2024-05-20 MED ORDER — DENOSUMAB 60 MG/ML ~~LOC~~ SOSY: 60.0000 mg | PREFILLED_SYRINGE | SUBCUTANEOUS | 1 refills | Status: DC

## 2024-05-20 MED ORDER — DENOSUMAB 60 MG/ML ~~LOC~~ SOSY
60.0000 mg | PREFILLED_SYRINGE | SUBCUTANEOUS | 1 refills | Status: AC
  Filled 2024-05-20: qty 1, 180d supply, fill #0

## 2024-05-20 NOTE — Addendum Note (Signed)
 Addended by: MELITON PERKINS T on: 05/20/2024 08:19 AM   Modules accepted: Orders

## 2024-05-20 NOTE — Telephone Encounter (Signed)
 Prescription for Prolia  #1, 1RF sent to [facility].

## 2024-05-20 NOTE — Progress Notes (Signed)
 [facility]  Referring Provider: Lyle Cathlyn JAYSON Nikki  Virtual Visit via Telephone Note  I connected with Shannon Klein on 05/20/2024 at  4:00 PM EST by telephone and verified that I am speaking with the correct person using two identifiers.  Location: Patient: home Provider: office   I discussed the limitations, risks, security and privacy concerns of performing an evaluation and management service by telephone and the availability of in person appointments. I also discussed with the patient that there may be a patient responsible charge related to this service. The patient expressed understanding and agreed to proceed.   HPI: Shannon Klein is a 64 y.o. female who presents to the pharmacotherapy clinic via telephone for continuation of therapy with Prolia  for osteoporosis.  Patient Active Problem List   Diagnosis Date Noted   Encounter for screening colonoscopy    Polyp of ascending colon    Other osteoporosis without current pathological fracture 03/22/2016   Surgical menopause 10/21/2012   Arthritis of hand 10/21/2012   Family history of ovarian cancer 06/19/2011    Patient's Medications  New Prescriptions   No medications on file  Previous Medications   BIOTIN 1 MG CAPS    Take by mouth.   BIOTIN W/ VITAMINS C & E (HAIR/SKIN/NAILS PO)    Take by mouth.   CALCIUM CARBONATE-VITAMIN D  (CALCIUM + D PO)    Take by mouth.   FLUTICASONE (FLONASE) 50 MCG/ACT NASAL SPRAY    Place into both nostrils as needed.   LORATADINE (CLARITIN) 10 MG TABLET    Take 10 mg by mouth as needed.   MELOXICAM (MOBIC) 7.5 MG TABLET    Take 7.5 mg by mouth as needed for pain.   OMEPRAZOLE (PRILOSEC) 20 MG CAPSULE    Take 20 mg by mouth as needed.   ROSUVASTATIN (CRESTOR) 5 MG TABLET    Take 2.5 mg by mouth daily.   UNABLE TO FIND    as needed. Med Name: steroid eye drop   UNABLE TO FIND    Med Name: cbd gummy   VITAMIN E 600 UNIT CAPSULE    Take 600 Units by mouth daily.   Modified Medications   Modified Medication Previous Medication   DENOSUMAB  (PROLIA ) 60 MG/ML SOSY INJECTION denosumab  (PROLIA ) 60 MG/ML SOSY injection      Inject 60 mg into the skin every 6 (six) months.    Inject 60 mg into the skin every 6 (six) months.  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   Hyperlipidemia    Osteopenia    Osteoporosis     Social History: Social History   Socioeconomic History   Marital status: Married    Spouse name: Not on file   Number of children: Not on file   Years of education: Not on file   Highest education level: Not on file  Occupational History   Not on file  Tobacco Use   Smoking status: Never   Smokeless tobacco: Never  Vaping Use   Vaping status: Never Used  Substance and Sexual Activity   Alcohol use: Yes    Alcohol/week: 7.0 standard drinks of alcohol    Types: 7 Glasses of wine per week    Comment: 1 glass of wine a night   Drug use: No   Sexual activity: Yes    Partners:  Male    Birth control/protection: Surgical    Comment: 1st intercourse- 19, partners- 3, ovaries removed  Other Topics Concern   Not on file  Social History Narrative   Not on file   Social Drivers of Health   Tobacco Use: Low Risk  (02/05/2024)   [facility]   Patient History    Smoking Tobacco Use: Never    Smokeless Tobacco Use: Never    Passive Exposure: Never  Financial Resource Strain: Low Risk  (02/04/2024)   Received from [facility]   Overall Financial Resource Strain (CARDIA)    Difficulty of Paying Living Expenses: Not hard at all  Food Insecurity: No Food Insecurity (02/04/2024)   Received from [facility]   Epic    Within the past 12 months, you worried that your food would run out before you got the money to buy more.: Never true    Within  the past 12 months, the food you bought just didn't last and you didn't have money to get more.: Never true  Transportation Needs: No Transportation Needs (02/04/2024)   Received from [facility] - Transportation    In the past 12 months, has lack of transportation kept you from medical appointments or from getting medications?: No    Lack of Transportation (Non-Medical): No  Physical Activity: Not on file  Stress: Not on file  Social Connections: Not on file  Depression (PHQ2-9): Low Risk (11/27/2023)   Depression (PHQ2-9)    PHQ-2 Score: 0  Alcohol Screen: Not on file  Housing: Unknown (02/04/2024)   Received from [facility]   Epic    In the last 12 months, was there a time when you were not able to pay the mortgage or rent on time?: No    Number of Times Moved in the Last Year: Not on file    At any time in the past 12 months, were you homeless or living in a shelter (including now)?: No  Utilities: Not At Risk (02/04/2024)   Received from [facility]   Epic    In the past 12 months has the electric, gas, oil, or water  company threatened to shut off services in your home?: No  Health Literacy: Not on file    Medication: Prolia  (denosumab )  Assessment: Patient remains stable and is appropriate to continue on Prolia  injections.   Vitamin D : 39ng/mL on 01/10/24   Calcium: 9.4mg /dL on 1/71/74   DEXA scan, most current DEXA scan T-score is minus 1.5 on 01/24/24, improved since start of therapy.  Plan:  -Counseled patient on purpose, proper use, and adverse effects of Prolia  (denosumab ).  Counseled patient that medication must be injected every 6 months by a healthcare professional.  Advised patient to take calcium 1200 mg daily and vitamin D  800 units daily.  Reviewed the most common adverse effects including risk of infection, osteonecrosis of the jaw, rash, and muscle/bone pain.  Patient confirms she does not have any  major dental work planned at this time.  Reviewed with patient the signs/symptoms of low calcium and advised patient to alert us  if she experiences these symptoms.  - Patient will be scheduled for continuation of therapy Prolia  (denosumab ) appointment at [facility] when medication arrives to office.  - Rx will be triaged to [facility] for courier to [facility].   I discussed the assessment and  treatment plan with the patient. The patient was provided an opportunity to ask questions and all were answered. The patient agreed with the plan and demonstrated an understanding of the instructions.   The patient was advised to call back or seek an in-person evaluation if the symptoms worsen or if the condition fails to improve as anticipated.  I provided 15 minutes of non-face-to-face time during this encounter.  Delon Brow, PharmD, CSP, AAHIVP, CPP Clinical Pharmacist Practitioner - Medication Therapy Disease Management/Specialty Pharmacy Services 05/20/2024, 4:24 PM     [1]  Allergies Allergen Reactions   Codeine Itching   Fosamax  [Alendronate  Sodium]     GERD was extremely harsh and she stated it took her a week to get better.   Erythromycin Nausea And Vomiting   Penicillins Hives

## 2024-05-20 NOTE — Progress Notes (Signed)
 Patient is agreeable to $249.78 copay and payment information has been collected. Patient aware she will need to speak with pharmacist before medication can be dispensed. MTDM visit scheduled. Will finish initial onboarding documentation once visit with Jen is complete.

## 2024-05-20 NOTE — Progress Notes (Signed)
 Specialty Pharmacy Initial Fill Coordination Note  KHYLAH KENDRA is a 64 y.o. female contacted today regarding initial fill of specialty medication(s) Denosumab  (PROLIA )   Patient requested Courier to Provider Office   Delivery date: 05/22/24   Verified address: [facility]  [address] [phone]   Medication will be filled on: 05/21/24   Patient is aware of $249.78 copayment.

## 2024-05-21 ENCOUNTER — Other Ambulatory Visit: Payer: Self-pay

## 2024-05-26 ENCOUNTER — Ambulatory Visit

## 2024-05-26 DIAGNOSIS — M810 Age-related osteoporosis without current pathological fracture: Secondary | ICD-10-CM

## 2024-05-26 NOTE — Progress Notes (Signed)
 Prolia  injection given SQ in the left arm. (IC, CCMA)

## 2024-06-23 ENCOUNTER — Other Ambulatory Visit (HOSPITAL_BASED_OUTPATIENT_CLINIC_OR_DEPARTMENT_OTHER)

## 2024-12-01 ENCOUNTER — Ambulatory Visit: Admitting: Obstetrics and Gynecology
